# Patient Record
Sex: Female | Born: 1980 | Hispanic: No | Marital: Married | Smoking: Never smoker
Health system: Southern US, Community
[De-identification: ages and names within clinical notes are randomized; demographics above are authoritative.]

---

## 2007-03-23 ENCOUNTER — Inpatient Hospital Stay (HOSPITAL_COMMUNITY): Admission: AD | Admit: 2007-03-23 | Discharge: 2007-03-25 | Payer: Self-pay | Admitting: Obstetrics and Gynecology

## 2010-09-21 NOTE — H&P (Signed)
Amanda Levy, Amanda Levy               ACCOUNT NO.:  [id number]   MEDICAL RECORD NO.:  [id number]          PATIENT TYPE:  INP   LOCATION:  9164                          FACILITY:  [facility]   PHYSICIAN:  Lenoard Aden, M.D.DATE OF BIRTH:  [birth year]   DATE OF ADMISSION:  03/23/2007  DATE OF DISCHARGE:                              HISTORY & PHYSICAL   CHIEF COMPLAINT:  Labor.   She is a 30 year old white female, G1, P0, at 39-1/2 weeks' gestation  with spontaneous onset of contractions this evening.  She has no known  drug allergies.   MEDICATIONS:  Prenatal vitamins.   She is a nonsmoker, nondrinker.  She denies domestic or physical  violence.  She has a noncontributory surgical history except for wisdom  tooth extraction and myringotomy tubes as a child.  She has a family  history of diabetes, thyroid dysfunction, migraine headaches, and  depression.   PHYSICAL EXAM:  She is a well-developed, well-nourished, white female in  no acute distress.  HEENT:  Normal.  LUNGS:  Clear.  HEART:  Regular rate.  ABDOMEN:  Soft, gravid, and nontender.  Estimated fetal weight is 7.5  pounds.  Cervix is 3-4, 80% vertex, -1.  EXTREMITIES:  No cords.  NEUROLOGIC:  Intact.  NST is reactive.   IMPRESSION:  Term intrauterine pregnancy in active labor.   PLAN:  Anticipate attempts at vaginal delivery.  Epidural as needed,  analgesia as requested.      Lenoard Aden, M.D.  Electronically Signed     RJT/MEDQ  D:  03/23/2007  T:  03/23/2007  Job:  [id number]

## 2010-10-01 ENCOUNTER — Inpatient Hospital Stay (HOSPITAL_COMMUNITY)
Admission: AD | Admit: 2010-10-01 | Discharge: 2010-10-03 | DRG: 775 | Disposition: A | Discharge status: Home / Self Care | Payer: 59 | Source: Ambulatory Visit | Attending: Obstetrics and Gynecology | Admitting: Obstetrics and Gynecology

## 2010-10-01 DIAGNOSIS — Z22330 Carrier of Group B streptococcus: Secondary | ICD-10-CM

## 2010-10-01 DIAGNOSIS — O99892 Other specified diseases and conditions complicating childbirth: Secondary | ICD-10-CM | POA: Diagnosis present

## 2010-10-01 LAB — CBC
HCT: 32.1 % — ABNORMAL LOW (ref 36.0–46.0)
Hemoglobin: 10.3 g/dL — ABNORMAL LOW (ref 12.0–15.0)
MCH: 28.1 pg (ref 26.0–34.0)
MCV: 87.5 fL (ref 78.0–100.0)
Platelets: 148 10*3/uL — ABNORMAL LOW (ref 150–400)
RBC: 3.67 MIL/uL — ABNORMAL LOW (ref 3.87–5.11)
WBC: 10.2 10*3/uL (ref 4.0–10.5)

## 2010-10-02 LAB — CBC
HCT: 27.9 % — ABNORMAL LOW (ref 36.0–46.0)
Hemoglobin: 9.1 g/dL — ABNORMAL LOW (ref 12.0–15.0)
MCH: 28.4 pg (ref 26.0–34.0)
MCHC: 32.6 g/dL (ref 30.0–36.0)
MCV: 87.2 fL (ref 78.0–100.0)
RBC: 3.2 MIL/uL — ABNORMAL LOW (ref 3.87–5.11)

## 2010-10-07 NOTE — Discharge Summary (Signed)
Amanda Levy, Amanda Levy               ACCOUNT NO.:  [id number]  MEDICAL RECORD NO.:  [id number]           PATIENT TYPE:  I  LOCATION:  9102                          FACILITY:  [facility]  PHYSICIAN:  Sherron Monday, MD        DATE OF BIRTH:  10-Nov-1980  DATE OF ADMISSION:  10/01/2010 DATE OF DISCHARGE:                              DISCHARGE SUMMARY   ADMITTING DIAGNOSIS:  Intrauterine pregnancy at term in labor with cervical change.  DISCHARGE DIAGNOSIS:  Intrauterine pregnancy at term in labor with cervical change, delivered.  HISTORY OF PRESENT ILLNESS:  A 30 year old, G3, P2-0-0-2 at 58 plus weeks in early labor with cervical change in the office from 1 to 2 or 3 cm.  Contractions are increasing in intensity and frequency.  No loss of fluid and small vaginal bleeding, otherwise uncomplicated prenatal care.  PAST MEDICAL HISTORY:  Not significant.  PAST SURGICAL HISTORY:  Not significant.  PAST OBSTETRICS AND GYNECOLOGIC HISTORY:  G1, 39-week, 8-pound 3-ounce female infant.  G2, 36-week, 8-pound 4-ounce female infant.  G3 is the present pregnancy.  No history of any abnormal Pap or sexually transmitted diseases.  MEDICATIONS:  None.  ALLERGIES:  No known drug allergies.  SOCIAL HISTORY:  Denies alcohol, tobacco or drug use and is married.  FAMILY HISTORY:  Significant for depression, diabetes, COPD, migraines and thyroid disease.  PRENATAL LABORATORY DATA:  O positive, antibody screen negative. Hemoglobin 12.4, rubella immune.  Pap smear within normal limits.  RPR nonreactive.  Urine culture negative, hepatitis B surface antigen negative, HIV negative, platelets 225,000.  Gonorrhea negative. Chlamydia negative.  Cystic fibrosis screen negative.  Glucola 72. Group B strep was positive.  First trimester screen within normal limits.  First trimester ultrasound consistent with dates, normal anatomy, posterior placenta, female infant anatomy screen.  PHYSICAL EXAMINATION:  Afebrile.   Vital signs stable with a benign exam. She was admitted.  I have given penicillin for group B strep prophylaxis.  Her membranes were ruptured for clear fluid, started her in the labor.  She progressed complete, complete +2 after pushing that was reduced.  She pushed for 10-15 minutes to deliver a viable female infant at 2140 on the night of the 25th, Apgars of 9 at one and 9 at five minutes and a weight of 9 pound 1 ounce.  Placenta was expressed intact after cord blood collection.  First-degree perineal laceration repaired with 3-0 Vicryl Rapide in a typical fashion.  Her postpartum course was relatively uncomplicated.  She remained afebrile.  Vital signs stable and benign exam.  Hemoglobin decreased from 10.3-9.1 which was well tolerated.  She was discharged home on postpartum day 2, ambulating, voiding, having no stated difficulties.  She is discharged home after her son was circumcised with routine discharge instructions and numbers to call with any questions or problems as well as prescription for Motrin, Vicodin and prenatal vitamins.  She will follow up in the office in approximately 6 weeks.     Sherron Monday, MD     JB/MEDQ  D:  10/03/2010  T:  10/03/2010  Job:  [id number]  Electronically Signed  by Sherron Monday MD on 10/07/2010 10:42:27 AM

## 2011-02-15 LAB — CBC
HCT: 24.8 — ABNORMAL LOW
Hemoglobin: 8.6 — ABNORMAL LOW
Hemoglobin: 11.7 — ABNORMAL LOW
MCHC: 34.8
MCHC: 34.9
MCV: 92.7
Platelets: 169
Platelets: 189
RDW: 12.9
RDW: 12.7

## 2014-12-29 ENCOUNTER — Other Ambulatory Visit: Payer: Self-pay | Admitting: Nurse Practitioner

## 2014-12-29 DIAGNOSIS — N63 Unspecified lump in unspecified breast: Secondary | ICD-10-CM

## 2014-12-31 ENCOUNTER — Ambulatory Visit
Admission: RE | Admit: 2014-12-31 | Discharge: 2014-12-31 | Disposition: A | Discharge status: Home / Self Care | Source: Ambulatory Visit | Attending: Nurse Practitioner | Admitting: Nurse Practitioner

## 2014-12-31 ENCOUNTER — Ambulatory Visit
Admission: RE | Admit: 2014-12-31 | Discharge: 2014-12-31 | Disposition: A | Discharge status: Home / Self Care | Payer: Medicaid Other | Source: Ambulatory Visit | Attending: Nurse Practitioner | Admitting: Nurse Practitioner

## 2014-12-31 DIAGNOSIS — N63 Unspecified lump in unspecified breast: Secondary | ICD-10-CM

## 2018-10-02 ENCOUNTER — Encounter: Payer: Self-pay | Admitting: Obstetrics and Gynecology

## 2018-10-22 ENCOUNTER — Ambulatory Visit (INDEPENDENT_AMBULATORY_CARE_PROVIDER_SITE_OTHER): Admitting: Obstetrics and Gynecology

## 2018-10-22 ENCOUNTER — Other Ambulatory Visit: Payer: Self-pay

## 2018-10-22 ENCOUNTER — Encounter: Payer: Self-pay | Admitting: Obstetrics and Gynecology

## 2018-10-22 VITALS — BP 108/68 | HR 72 | Temp 97.9°F | Ht 64.76 in | Wt 153.2 lb

## 2018-10-22 DIAGNOSIS — Z124 Encounter for screening for malignant neoplasm of cervix: Secondary | ICD-10-CM

## 2018-10-22 DIAGNOSIS — Z23 Encounter for immunization: Secondary | ICD-10-CM

## 2018-10-22 DIAGNOSIS — Z01419 Encounter for gynecological examination (general) (routine) without abnormal findings: Secondary | ICD-10-CM

## 2018-10-22 DIAGNOSIS — Z0000 Encounter for general adult medical examination without abnormal findings: Secondary | ICD-10-CM

## 2018-10-22 NOTE — Progress Notes (Signed)
38 y.o. G32P3003 Married Other or two or more races Not Hispanic or Latino female here for annual exam.  Sexually active, using condoms. Considering vasectomy. No dyspareunia.   Period Cycle (Days): 28 Period Duration (Days): 4 days Period Pattern: Regular Menstrual Flow: Moderate, Heavy Menstrual Control: Tampon Menstrual Control Change Freq (Hours): changes tampon every 2 hours Dysmenorrhea: None  She can saturate a super + tampon in 4 hours.   Patient's last menstrual period was 09/26/2018 (approximate).          Sexually active: Yes.    The current method of family planning is condoms most of the time.    Exercising: Yes.    crossfit Smoker:  no  Health Maintenance: Pap:  6 years ago, WNL History of abnormal Pap:  no MMG:  12/31/2014 Birads 1 negative TDaP:  Unsure Gardasil: No   reports that she has never smoked. She has never used smokeless tobacco. She reports current alcohol use. She reports that she does not use drugs. Rare ETOH. 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 She has 3 boys, 11,10 and 8.   History reviewed. No pertinent past medical history.  History reviewed. No pertinent surgical history.  No current outpatient medications on file.   No current facility-administered medications for this visit.     Family History  Problem Relation Age of Onset  . Diabetes Maternal Grandmother   . Diabetes Paternal Grandmother     Review of Systems  Constitutional: Negative.   HENT: Negative.   Eyes: Negative.   Respiratory: Negative.   Cardiovascular: Negative.   Gastrointestinal: Negative.   Endocrine: Negative.   Genitourinary: Negative.   Musculoskeletal: Negative.   Skin: Negative.   Allergic/Immunologic: Negative.   Neurological: Negative.   Hematological: Negative.   Psychiatric/Behavioral: Negative.     Exam:   BP 108/68 (BP Location: Right Arm, Patient Position: Sitting, Cuff Size: Normal)   Pulse 72   Temp 97.9 F (36.6 C) (Skin)    Ht 5' 4.76" (1.645 m)   Wt 153 lb 3.2 oz (69.5 kg)   LMP 09/26/2018 (Approximate)   BMI 25.68 kg/m   Weight change: @WEIGHTCHANGE @ Height:   Height: 5' 4.76" (164.5 cm)  Ht Readings from Last 3 Encounters:  10/22/18 5' 4.76" (1.645 m)    General appearance: alert, cooperative and appears stated age Head: Normocephalic, without obvious abnormality, atraumatic Neck: no adenopathy, supple, symmetrical, trachea midline and thyroid normal to inspection and palpation Lungs: clear to auscultation bilaterally Cardiovascular: regular rate and rhythm Breasts: normal appearance, no masses or tenderness Abdomen: soft, non-tender; non distended,  no masses,  no organomegaly Extremities: extremities normal, atraumatic, no cyanosis or edema Skin: Skin color, texture, turgor normal. No rashes or lesions Lymph nodes: Cervical, supraclavicular, and axillary nodes normal. No abnormal inguinal nodes palpated Neurologic: Grossly normal   Pelvic: External genitalia:  no lesions              Urethra:  normal appearing urethra with no masses, tenderness or lesions              Bartholins and Skenes: normal                 Vagina: normal appearing vagina with normal color and discharge, no lesions              Cervix: no lesions               Bimanual Exam:  Uterus:  normal size, contour,  position, consistency, mobility, non-tender, anteverted              Adnexa: no mass, fullness, tenderness               Rectovaginal: Confirms               Anus:  normal sphincter tone, no lesions  Chaperone was present for exam.  A:  Well Woman with normal exam  P:   Pap with hpv  Discussed breast self exam  Discussed calcium and vit D intake  Screening labs  TDAP  Condoms for contraception, considering vasectomy

## 2018-10-22 NOTE — Patient Instructions (Signed)
EXERCISE AND DIET:  We recommended that you start or continue a regular exercise program for good health. Regular exercise means any activity that makes your heart beat faster and makes you sweat.  We recommend exercising at least 30 minutes per day at least 3 days a week, preferably 4 or 5.  We also recommend a diet low in fat and sugar.  Inactivity, poor dietary choices and obesity can cause diabetes, heart attack, stroke, and kidney damage, among others.    ALCOHOL AND SMOKING:  Women should limit their alcohol intake to no more than 7 drinks/beers/glasses of wine (combined, not each!) per week. Moderation of alcohol intake to this level decreases your risk of breast cancer and liver damage. And of course, no recreational drugs are part of a healthy lifestyle.  And absolutely no smoking or even second hand smoke. Most people know smoking can cause heart and lung diseases, but did you know it also contributes to weakening of your bones? Aging of your skin?  Yellowing of your teeth and nails?  CALCIUM AND VITAMIN D:  Adequate intake of calcium and Vitamin D are recommended.  The recommendations for exact amounts of these supplements seem to change often, but generally speaking 1,000 mg of calcium (between diet and supplement) and 800 units of Vitamin D per day seems prudent. Certain women may benefit from higher intake of Vitamin D.  If you are among these women, your doctor will have told you during your visit.    PAP SMEARS:  Pap smears, to check for cervical cancer or precancers,  have traditionally been done yearly, although recent scientific advances have shown that most women can have pap smears less often.  However, every woman still should have a physical exam from her gynecologist every year. It will include a breast check, inspection of the vulva and vagina to check for abnormal growths or skin changes, a visual exam of the cervix, and then an exam to evaluate the size and shape of the uterus and  ovaries.  And after 36 years of age, a rectal exam is indicated to check for rectal cancers. We will also provide age appropriate advice regarding health maintenance, like when you should have certain vaccines, screening for sexually transmitted diseases, bone density testing, colonoscopy, mammograms, etc.   MAMMOGRAMS:  All women over 40 years old should have a yearly mammogram. Many facilities now offer a "3D" mammogram, which may cost around $50 extra out of pocket. If possible,  we recommend you accept the option to have the 3D mammogram performed.  It both reduces the number of women who will be called back for extra views which then turn out to be normal, and it is better than the routine mammogram at detecting truly abnormal areas.    COLON CANCER SCREENING: Now recommend starting at age 45. At this time colonoscopy is not covered for routine screening until 50. There are take home tests that can be done between 45-49.   COLONOSCOPY:  Colonoscopy to screen for colon cancer is recommended for all women at age 50.  We know, you hate the idea of the prep.  We agree, BUT, having colon cancer and not knowing it is worse!!  Colon cancer so often starts as a polyp that can be seen and removed at colonscopy, which can quite literally save your life!  And if your first colonoscopy is normal and you have no family history of colon cancer, most women don't have to have it again for   10 years.  Once every ten years, you can do something that may end up saving your life, right?  We will be happy to help you get it scheduled when you are ready.  Be sure to check your insurance coverage so you understand how much it will cost.  It may be covered as a preventative service at no cost, but you should check your particular policy.      Breast Self-Awareness Breast self-awareness means being familiar with how your breasts look and feel. It involves checking your breasts regularly and reporting any changes to your  health care provider. Practicing breast self-awareness is important. A change in your breasts can be a sign of a serious medical problem. Being familiar with how your breasts look and feel allows you to find any problems early, when treatment is more likely to be successful. All women should practice breast self-awareness, including women who have had breast implants. How to do a breast self-exam One way to learn what is normal for your breasts and whether your breasts are changing is to do a breast self-exam. To do a breast self-exam: Look for Changes  1. Remove all the clothing above your waist. 2. Stand in front of a mirror in a room with good lighting. 3. Put your hands on your hips. 4. Push your hands firmly downward. 5. Compare your breasts in the mirror. Look for differences between them (asymmetry), such as: ? Differences in shape. ? Differences in size. ? Puckers, dips, and bumps in one breast and not the other. 6. Look at each breast for changes in your skin, such as: ? Redness. ? Scaly areas. 7. Look for changes in your nipples, such as: ? Discharge. ? Bleeding. ? Dimpling. ? Redness. ? A change in position. Feel for Changes Carefully feel your breasts for lumps and changes. It is best to do this while lying on your back on the floor and again while sitting or standing in the shower or tub with soapy water on your skin. Feel each breast in the following way:  Place the arm on the side of the breast you are examining above your head.  Feel your breast with the other hand.  Start in the nipple area and make  inch (2 cm) overlapping circles to feel your breast. Use the pads of your three middle fingers to do this. Apply light pressure, then medium pressure, then firm pressure. The light pressure will allow you to feel the tissue closest to the skin. The medium pressure will allow you to feel the tissue that is a little deeper. The firm pressure will allow you to feel the tissue  close to the ribs.  Continue the overlapping circles, moving downward over the breast until you feel your ribs below your breast.  Move one finger-width toward the center of the body. Continue to use the  inch (2 cm) overlapping circles to feel your breast as you move slowly up toward your collarbone.  Continue the up and down exam using all three pressures until you reach your armpit.  Write Down What You Find  Write down what is normal for each breast and any changes that you find. Keep a written record with breast changes or normal findings for each breast. By writing this information down, you do not need to depend only on memory for size, tenderness, or location. Write down where you are in your menstrual cycle, if you are still menstruating. If you are having trouble noticing differences   in your breasts, do not get discouraged. With time you will become more familiar with the variations in your breasts and more comfortable with the exam. How often should I examine my breasts? Examine your breasts every month. If you are breastfeeding, the best time to examine your breasts is after a feeding or after using a breast pump. If you menstruate, the best time to examine your breasts is 5-7 days after your period is over. During your period, your breasts are lumpier, and it may be more difficult to notice changes. When should I see my health care provider? See your health care provider if you notice:  A change in shape or size of your breasts or nipples.  A change in the skin of your breast or nipples, such as a reddened or scaly area.  Unusual discharge from your nipples.  A lump or thick area that was not there before.  Pain in your breasts.  Anything that concerns you.  

## 2018-10-23 ENCOUNTER — Other Ambulatory Visit (HOSPITAL_COMMUNITY)
Admission: RE | Admit: 2018-10-23 | Discharge: 2018-10-23 | Disposition: A | Discharge status: Home / Self Care | Source: Ambulatory Visit | Attending: Obstetrics and Gynecology | Admitting: Obstetrics and Gynecology

## 2018-10-23 DIAGNOSIS — Z124 Encounter for screening for malignant neoplasm of cervix: Secondary | ICD-10-CM | POA: Insufficient documentation

## 2018-10-23 NOTE — Addendum Note (Signed)
Addended by: Dorothy Spark on: 10/23/2018 06:02 PM   Modules accepted: Orders

## 2018-10-24 ENCOUNTER — Telehealth: Payer: Self-pay

## 2018-10-24 LAB — CBC
Hematocrit: 38.7 % (ref 34.0–46.6)
Hemoglobin: 13 g/dL (ref 11.1–15.9)
MCH: 31.3 pg (ref 26.6–33.0)
MCHC: 33.6 g/dL (ref 31.5–35.7)
MCV: 93 fL (ref 79–97)
Platelets: 197 10*3/uL (ref 150–450)
RBC: 4.15 x10E6/uL (ref 3.77–5.28)
RDW: 11.6 % — ABNORMAL LOW (ref 11.7–15.4)
WBC: 6.5 10*3/uL (ref 3.4–10.8)

## 2018-10-24 LAB — COMPREHENSIVE METABOLIC PANEL
ALT: 13 IU/L (ref 0–32)
AST: 19 IU/L (ref 0–40)
Albumin/Globulin Ratio: 2 (ref 1.2–2.2)
Albumin: 4.5 g/dL (ref 3.8–4.8)
Alkaline Phosphatase: 45 IU/L (ref 39–117)
BUN/Creatinine Ratio: 20 (ref 9–23)
BUN: 20 mg/dL (ref 6–20)
Bilirubin Total: 0.4 mg/dL (ref 0.0–1.2)
CO2: 21 mmol/L (ref 20–29)
Calcium: 9.1 mg/dL (ref 8.7–10.2)
Chloride: 103 mmol/L (ref 96–106)
Creatinine, Ser: 1.02 mg/dL — ABNORMAL HIGH (ref 0.57–1.00)
GFR calc Af Amer: 82 mL/min/{1.73_m2} (ref >59)
GFR calc non Af Amer: 71 mL/min/{1.73_m2} (ref >59)
Globulin, Total: 2.3 g/dL (ref 1.5–4.5)
Glucose: 86 mg/dL (ref 65–99)
Potassium: 4 mmol/L (ref 3.5–5.2)
Sodium: 138 mmol/L (ref 134–144)
Total Protein: 6.8 g/dL (ref 6.0–8.5)

## 2018-10-24 LAB — LIPID PANEL
Chol/HDL Ratio: 1.7 ratio (ref 0.0–4.4)
Cholesterol, Total: 138 mg/dL (ref 100–199)
HDL: 81 mg/dL (ref >39)
LDL Calculated: 46 mg/dL (ref 0–99)
Triglycerides: 57 mg/dL (ref 0–149)
VLDL Cholesterol Cal: 11 mg/dL (ref 5–40)

## 2018-10-24 NOTE — Telephone Encounter (Signed)
Left message to call Seneca Hoback at [phone]. 

## 2018-10-24 NOTE — Telephone Encounter (Signed)
-----   Message from Salvadore Dom, MD sent at 10/24/2018 11:40 AM EDT ----- Please let the patient know that her creatinine is just above normal. This can happen from dehydration. The rest of her blood work is great. Her pap is pending. She should return well hydrated for a renal panel.

## 2018-10-25 LAB — CYTOLOGY - PAP
Diagnosis: NEGATIVE
HPV: NOT DETECTED

## 2018-10-29 NOTE — Telephone Encounter (Signed)
Left message to call Kaitlyn at [phone]. 

## 2018-10-30 NOTE — Telephone Encounter (Signed)
Patient returned call

## 2018-10-30 NOTE — Telephone Encounter (Signed)
Left message to call Kaitlyn at [phone]. 

## 2018-11-08 NOTE — Telephone Encounter (Signed)
Left message to call [name] at [phone]. 

## 2018-11-19 NOTE — Telephone Encounter (Signed)
Unable to reach patient. MyChart message sent. Encounter closed.

## 2019-09-14 ENCOUNTER — Ambulatory Visit: Attending: Internal Medicine

## 2019-09-14 DIAGNOSIS — Z23 Encounter for immunization: Secondary | ICD-10-CM

## 2019-09-14 NOTE — Progress Notes (Signed)
   Covid-19 Vaccination Clinic  Name:  Amanda Levy    MRN: [id number] DOB: December 14, 1980  09/14/2019  [name] was observed post Covid-19 immunization for 15 minutes without incident. She was provided with Vaccine Information Sheet and instruction to access the V-Safe system.   [name] was instructed to call 911 with any severe reactions post vaccine: Marland Kitchen Difficulty breathing  . Swelling of face and throat  . A fast heartbeat  . A bad rash all over body  . Dizziness and weakness   Immunizations Administered    Name Date Dose VIS Date Route   Pfizer COVID-19 Vaccine 09/14/2019 11:34 AM 0.3 mL 07/03/2018 Intramuscular   Manufacturer: [employer], [employer]   Lot: [id number]   NDC: [address]-5

## 2019-10-08 ENCOUNTER — Ambulatory Visit: Attending: Internal Medicine

## 2019-10-28 NOTE — Progress Notes (Deleted)
39 y.o. G62P3003 Married Other or two or more races Not Hispanic or Latino female here for annual exam.      No LMP recorded.          Sexually active: {yes no:314532}  The current method of family planning is {contraception:315051}.    Exercising: {yes no:314532}  {types:19826} Smoker:  {YES [id number]  Health Maintenance: Pap:  10/23/18 WNL HR HPV Neg  History of abnormal Pap:  no MMG:   12/31/2014 Birads 1 negative TDaP:  10/22/2018 Gardasil: no   reports that she has never smoked. She has never used smokeless tobacco. She reports current alcohol use. She reports that she does not use drugs.  No past medical history on file.  No past surgical history on file.  No current outpatient medications on file.   No current facility-administered medications for this visit.    Family History  Problem Relation Age of Onset  . Diabetes Maternal Grandmother   . Diabetes Paternal Grandmother     Review of Systems  Exam:   There were no vitals taken for this visit.  Weight change: @WEIGHTCHANGE @ Height:      Ht Readings from Last 3 Encounters:  10/22/18 5' 4.76" (1.645 m)    General appearance: alert, cooperative and appears stated age Head: Normocephalic, without obvious abnormality, atraumatic Neck: no adenopathy, supple, symmetrical, trachea midline and thyroid {CHL AMB PHY EX THYROID NORM DEFAULT:[phone]::"normal to inspection and palpation"} Lungs: clear to auscultation bilaterally Cardiovascular: regular rate and rhythm Breasts: {Exam; breast:13139::"normal appearance, no masses or tenderness"} Abdomen: soft, non-tender; non distended,  no masses,  no organomegaly Extremities: extremities normal, atraumatic, no cyanosis or edema Skin: Skin color, texture, turgor normal. No rashes or lesions Lymph nodes: Cervical, supraclavicular, and axillary nodes normal. No abnormal inguinal nodes palpated Neurologic: Grossly normal   Pelvic: External genitalia:  no lesions               Urethra:  normal appearing urethra with no masses, tenderness or lesions              Bartholins and Skenes: normal                 Vagina: normal appearing vagina with normal color and discharge, no lesions              Cervix: {CHL AMB PHY EX CERVIX NORM [phone]::"no lesions"}               Bimanual Exam:  Uterus:  {CHL AMB PHY EX UTERUS NORM DEFAULT:[phone]::"normal size, contour, position, consistency, mobility, non-tender"}              Adnexa: {CHL AMB PHY EX ADNEXA NO MASS [phone]::"no mass, fullness, tenderness"}               Rectovaginal: Confirms               Anus:  normal sphincter tone, no lesions  *** chaperoned for the exam.  A:  Well Woman with normal exam  P:

## 2019-10-30 ENCOUNTER — Ambulatory Visit: Admitting: Obstetrics and Gynecology

## 2020-01-29 NOTE — Progress Notes (Signed)
39 y.o. G57P3003 Married Other or two or more races Not Hispanic or Latino female here for annual exam.  No concerns. Period Cycle (Days): 28 Period Duration (Days): 4 Period Pattern: Regular Menstrual Flow: Light Menstrual Control: Tampon Menstrual Control Change Freq (Hours): 3 Dysmenorrhea: (!) Mild Dysmenorrhea Symptoms: Cramping  Patient's last menstrual period was 01/08/2020.          Sexually active: Yes.    The current method of family planning is condoms always.    Exercising: Yes.    cross fit  Smoker:  no  Health Maintenance: Pap: 10/23/18 WNL Hr HPV Neg  History of abnormal Pap:  no MMG:  Never  BMD:   Never  Colonoscopy: never  TDaP:  10/22/18  Gardasil: no   reports that she has never smoked. She has never used smokeless tobacco. She reports current alcohol use. She reports that she does not use drugs. 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 No pertinent past medical history.  History reviewed. No pertinent surgical history.  No current outpatient medications on file.   No current facility-administered medications for this visit.    Family History  Problem Relation Age of Onset  . Diabetes Maternal Grandmother   . Diabetes Paternal Grandmother     Review of Systems  All other systems reviewed and are negative.   Exam:   BP 98/60   Pulse (!) 59   Ht 5' 4.5" (1.638 m)   Wt 144 lb (65.3 kg)   LMP 01/08/2020   SpO2 99%   BMI 24.34 kg/m   Weight change: @WEIGHTCHANGE @ Height:   Height: 5' 4.5" (163.8 cm)  Ht Readings from Last 3 Encounters:  01/30/20 5' 4.5" (1.638 m)  10/22/18 5' 4.76" (1.645 m)    General appearance: alert, cooperative and appears stated age Head: Normocephalic, without obvious abnormality, atraumatic Neck: no adenopathy, supple, symmetrical, trachea midline and thyroid normal to inspection and palpation Lungs: clear to auscultation bilaterally Cardiovascular: regular  rate and rhythm Breasts: normal appearance, no masses or tenderness. Bilateral fibrocystic changes, symmetrical Abdomen: soft, non-tender; non distended,  no masses,  no organomegaly Extremities: extremities normal, atraumatic, no cyanosis or edema Skin: Skin color, texture, turgor normal. No rashes or lesions Lymph nodes: Cervical, supraclavicular, and axillary nodes normal. No abnormal inguinal nodes palpated Neurologic: Grossly normal   Pelvic: External genitalia:  no lesions              Urethra:  normal appearing urethra with no masses, tenderness or lesions              Bartholins and Skenes: normal                 Vagina: normal appearing vagina with normal color and discharge, no lesions              Cervix: no lesions               Bimanual Exam:  Uterus:  normal size, contour, position, consistency, mobility, non-tender and anteverted              Adnexa: no mass, fullness, tenderness               Rectovaginal: Confirms               Anus:  normal sphincter tone, no lesions  10/24/18 chaperoned for the exam.  A:  Well Woman with  normal exam  P:   No pap this year  Screening labs  Condoms for contraception  Discussed breast self exam  Discussed calcium and vit D intake

## 2020-01-30 ENCOUNTER — Encounter: Payer: Self-pay | Admitting: Obstetrics and Gynecology

## 2020-01-30 ENCOUNTER — Other Ambulatory Visit: Payer: Self-pay

## 2020-01-30 ENCOUNTER — Ambulatory Visit (INDEPENDENT_AMBULATORY_CARE_PROVIDER_SITE_OTHER): Admitting: Obstetrics and Gynecology

## 2020-01-30 VITALS — BP 98/60 | HR 59 | Ht 64.5 in | Wt 144.0 lb

## 2020-01-30 DIAGNOSIS — Z0000 Encounter for general adult medical examination without abnormal findings: Secondary | ICD-10-CM | POA: Diagnosis not present

## 2020-01-30 DIAGNOSIS — Z01419 Encounter for gynecological examination (general) (routine) without abnormal findings: Secondary | ICD-10-CM

## 2020-01-30 NOTE — Patient Instructions (Signed)
EXERCISE AND DIET:  We recommended that you start or continue a regular exercise program for good health. Regular exercise means any activity that makes your heart beat faster and makes you sweat.  We recommend exercising at least 30 minutes per day at least 3 days a week, preferably 4 or 5.  We also recommend a diet low in fat and sugar.  Inactivity, poor dietary choices and obesity can cause diabetes, heart attack, stroke, and kidney damage, among others.    ALCOHOL AND SMOKING:  Women should limit their alcohol intake to no more than 7 drinks/beers/glasses of wine (combined, not each!) per week. Moderation of alcohol intake to this level decreases your risk of breast cancer and liver damage. And of course, no recreational drugs are part of a healthy lifestyle.  And absolutely no smoking or even second hand smoke. Most people know smoking can cause heart and lung diseases, but did you know it also contributes to weakening of your bones? Aging of your skin?  Yellowing of your teeth and nails?  CALCIUM AND VITAMIN D:  Adequate intake of calcium and Vitamin D are recommended.  The recommendations for exact amounts of these supplements seem to change often, but generally speaking 1,000 mg of calcium (between diet and supplement) and 800 units of Vitamin D per day seems prudent. Certain women may benefit from higher intake of Vitamin D.  If you are among these women, your doctor will have told you during your visit.    PAP SMEARS:  Pap smears, to check for cervical cancer or precancers,  have traditionally been done yearly, although recent scientific advances have shown that most women can have pap smears less often.  However, every woman still should have a physical exam from her gynecologist every year. It will include a breast check, inspection of the vulva and vagina to check for abnormal growths or skin changes, a visual exam of the cervix, and then an exam to evaluate the size and shape of the uterus and  ovaries.  And after 37 years of age, a rectal exam is indicated to check for rectal cancers. We will also provide age appropriate advice regarding health maintenance, like when you should have certain vaccines, screening for sexually transmitted diseases, bone density testing, colonoscopy, mammograms, etc.   MAMMOGRAMS:  All women over 40 years old should have a yearly mammogram. Many facilities now offer a "3D" mammogram, which may cost around $50 extra out of pocket. If possible,  we recommend you accept the option to have the 3D mammogram performed.  It both reduces the number of women who will be called back for extra views which then turn out to be normal, and it is better than the routine mammogram at detecting truly abnormal areas.    COLON CANCER SCREENING: Now recommend starting at age 45. At this time colonoscopy is not covered for routine screening until 50. There are take home tests that can be done between 45-49.   COLONOSCOPY:  Colonoscopy to screen for colon cancer is recommended for all women at age 50.  We know, you hate the idea of the prep.  We agree, BUT, having colon cancer and not knowing it is worse!!  Colon cancer so often starts as a polyp that can be seen and removed at colonscopy, which can quite literally save your life!  And if your first colonoscopy is normal and you have no family history of colon cancer, most women don't have to have it again for   10 years.  Once every ten years, you can do something that may end up saving your life, right?  We will be happy to help you get it scheduled when you are ready.  Be sure to check your insurance coverage so you understand how much it will cost.  It may be covered as a preventative service at no cost, but you should check your particular policy.      Breast Self-Awareness Breast self-awareness means being familiar with how your breasts look and feel. It involves checking your breasts regularly and reporting any changes to your  health care provider. Practicing breast self-awareness is important. A change in your breasts can be a sign of a serious medical problem. Being familiar with how your breasts look and feel allows you to find any problems early, when treatment is more likely to be successful. All women should practice breast self-awareness, including women who have had breast implants. How to do a breast self-exam One way to learn what is normal for your breasts and whether your breasts are changing is to do a breast self-exam. To do a breast self-exam: Look for Changes  1. Remove all the clothing above your waist. 2. Stand in front of a mirror in a room with good lighting. 3. Put your hands on your hips. 4. Push your hands firmly downward. 5. Compare your breasts in the mirror. Look for differences between them (asymmetry), such as: ? Differences in shape. ? Differences in size. ? Puckers, dips, and bumps in one breast and not the other. 6. Look at each breast for changes in your skin, such as: ? Redness. ? Scaly areas. 7. Look for changes in your nipples, such as: ? Discharge. ? Bleeding. ? Dimpling. ? Redness. ? A change in position. Feel for Changes Carefully feel your breasts for lumps and changes. It is best to do this while lying on your back on the floor and again while sitting or standing in the shower or tub with soapy water on your skin. Feel each breast in the following way:  Place the arm on the side of the breast you are examining above your head.  Feel your breast with the other hand.  Start in the nipple area and make  inch (2 cm) overlapping circles to feel your breast. Use the pads of your three middle fingers to do this. Apply light pressure, then medium pressure, then firm pressure. The light pressure will allow you to feel the tissue closest to the skin. The medium pressure will allow you to feel the tissue that is a little deeper. The firm pressure will allow you to feel the tissue  close to the ribs.  Continue the overlapping circles, moving downward over the breast until you feel your ribs below your breast.  Move one finger-width toward the center of the body. Continue to use the  inch (2 cm) overlapping circles to feel your breast as you move slowly up toward your collarbone.  Continue the up and down exam using all three pressures until you reach your armpit.  Write Down What You Find  Write down what is normal for each breast and any changes that you find. Keep a written record with breast changes or normal findings for each breast. By writing this information down, you do not need to depend only on memory for size, tenderness, or location. Write down where you are in your menstrual cycle, if you are still menstruating. If you are having trouble noticing differences   in your breasts, do not get discouraged. With time you will become more familiar with the variations in your breasts and more comfortable with the exam. How often should I examine my breasts? Examine your breasts every month. If you are breastfeeding, the best time to examine your breasts is after a feeding or after using a breast pump. If you menstruate, the best time to examine your breasts is 5-7 days after your period is over. During your period, your breasts are lumpier, and it may be more difficult to notice changes. When should I see my health care provider? See your health care provider if you notice:  A change in shape or size of your breasts or nipples.  A change in the skin of your breast or nipples, such as a reddened or scaly area.  Unusual discharge from your nipples.  A lump or thick area that was not there before.  Pain in your breasts.  Anything that concerns you.  

## 2020-01-31 LAB — CBC
Hematocrit: 39.3 % (ref 34.0–46.6)
Hemoglobin: 13 g/dL (ref 11.1–15.9)
MCH: 30.6 pg (ref 26.6–33.0)
MCHC: 33.1 g/dL (ref 31.5–35.7)
MCV: 93 fL (ref 79–97)
Platelets: 191 10*3/uL (ref 150–450)
RBC: 4.25 x10E6/uL (ref 3.77–5.28)
RDW: 11.8 % (ref 11.7–15.4)
WBC: 5.7 10*3/uL (ref 3.4–10.8)

## 2020-01-31 LAB — LIPID PANEL
Chol/HDL Ratio: 1.8 ratio (ref 0.0–4.4)
Cholesterol, Total: 151 mg/dL (ref 100–199)
HDL: 82 mg/dL (ref >39)
LDL Chol Calc (NIH): 58 mg/dL (ref 0–99)
Triglycerides: 52 mg/dL (ref 0–149)
VLDL Cholesterol Cal: 11 mg/dL (ref 5–40)

## 2020-01-31 LAB — COMPREHENSIVE METABOLIC PANEL
ALT: 10 IU/L (ref 0–32)
AST: 16 IU/L (ref 0–40)
Albumin/Globulin Ratio: 1.9 (ref 1.2–2.2)
Albumin: 4.8 g/dL (ref 3.8–4.8)
Alkaline Phosphatase: 45 IU/L (ref 44–121)
BUN/Creatinine Ratio: 17 (ref 9–23)
BUN: 16 mg/dL (ref 6–20)
Bilirubin Total: 0.6 mg/dL (ref 0.0–1.2)
CO2: 25 mmol/L (ref 20–29)
Calcium: 9.3 mg/dL (ref 8.7–10.2)
Chloride: 102 mmol/L (ref 96–106)
Creatinine, Ser: 0.94 mg/dL (ref 0.57–1.00)
GFR calc Af Amer: 90 mL/min/{1.73_m2} (ref >59)
GFR calc non Af Amer: 78 mL/min/{1.73_m2} (ref >59)
Globulin, Total: 2.5 g/dL (ref 1.5–4.5)
Glucose: 78 mg/dL (ref 65–99)
Potassium: 3.9 mmol/L (ref 3.5–5.2)
Sodium: 139 mmol/L (ref 134–144)
Total Protein: 7.3 g/dL (ref 6.0–8.5)

## 2021-01-04 ENCOUNTER — Emergency Department (HOSPITAL_BASED_OUTPATIENT_CLINIC_OR_DEPARTMENT_OTHER): Payer: BC Managed Care – PPO

## 2021-01-04 ENCOUNTER — Encounter (HOSPITAL_BASED_OUTPATIENT_CLINIC_OR_DEPARTMENT_OTHER): Payer: Self-pay | Admitting: Obstetrics and Gynecology

## 2021-01-04 ENCOUNTER — Emergency Department (HOSPITAL_BASED_OUTPATIENT_CLINIC_OR_DEPARTMENT_OTHER)
Admission: EM | Admit: 2021-01-04 | Discharge: 2021-01-05 | Disposition: A | Discharge status: Home / Self Care | Payer: BC Managed Care – PPO | Attending: Emergency Medicine | Admitting: Emergency Medicine

## 2021-01-04 ENCOUNTER — Other Ambulatory Visit: Payer: Self-pay

## 2021-01-04 DIAGNOSIS — G839 Paralytic syndrome, unspecified: Secondary | ICD-10-CM

## 2021-01-04 DIAGNOSIS — R531 Weakness: Secondary | ICD-10-CM | POA: Insufficient documentation

## 2021-01-04 DIAGNOSIS — R202 Paresthesia of skin: Secondary | ICD-10-CM | POA: Diagnosis not present

## 2021-01-04 LAB — CBC
HCT: 36.8 % (ref 36.0–46.0)
Hemoglobin: 12.7 g/dL (ref 12.0–15.0)
MCH: 30.7 pg (ref 26.0–34.0)
MCHC: 34.5 g/dL (ref 30.0–36.0)
MCV: 88.9 fL (ref 80.0–100.0)
Platelets: 201 10*3/uL (ref 150–400)
RBC: 4.14 MIL/uL (ref 3.87–5.11)
RDW: 12 % (ref 11.5–15.5)
WBC: 5.4 10*3/uL (ref 4.0–10.5)
nRBC: 0 % (ref 0.0–0.2)

## 2021-01-04 LAB — URINALYSIS, ROUTINE W REFLEX MICROSCOPIC
Bilirubin Urine: NEGATIVE
Glucose, UA: NEGATIVE mg/dL
Hgb urine dipstick: NEGATIVE
Ketones, ur: NEGATIVE mg/dL
Leukocytes,Ua: NEGATIVE
Nitrite: NEGATIVE
Protein, ur: NEGATIVE mg/dL
Specific Gravity, Urine: 1.017 (ref 1.005–1.030)
pH: 6.5 (ref 5.0–8.0)

## 2021-01-04 LAB — BASIC METABOLIC PANEL
Anion gap: 10 (ref 5–15)
BUN: 14 mg/dL (ref 6–20)
CO2: 23 mmol/L (ref 22–32)
Calcium: 9.2 mg/dL (ref 8.9–10.3)
Chloride: 104 mmol/L (ref 98–111)
Creatinine, Ser: 1.05 mg/dL — ABNORMAL HIGH (ref 0.44–1.00)
GFR, Estimated: >60 mL/min (ref >60)
Glucose, Bld: 95 mg/dL (ref 70–99)
Potassium: 3.5 mmol/L (ref 3.5–5.1)
Sodium: 137 mmol/L (ref 135–145)

## 2021-01-04 LAB — PREGNANCY, URINE: Preg Test, Ur: NEGATIVE

## 2021-01-04 IMAGING — CT CT HEAD W/O CM
4 series · 17 of 47 positions shown, 19 images · non-contrast
Comparison: None.

CLINICAL DATA: Right-sided arm and leg weakness

EXAM:
CT HEAD WITHOUT CONTRAST
TECHNIQUE: Contiguous axial images were obtained from the base of the skull
through the vertex without intravenous contrast.

[Series 2: head wo · axial · 0.45mm/px · z∈[-402,-286]mm · 7 of 31 slices shown, 9 images]
[im 4/31  brain]
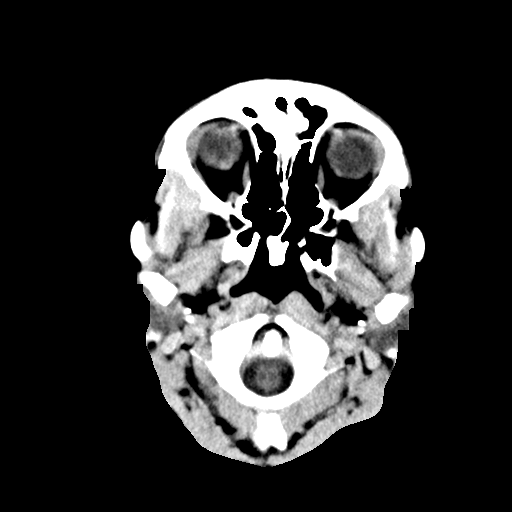
[im 4/31  bone]
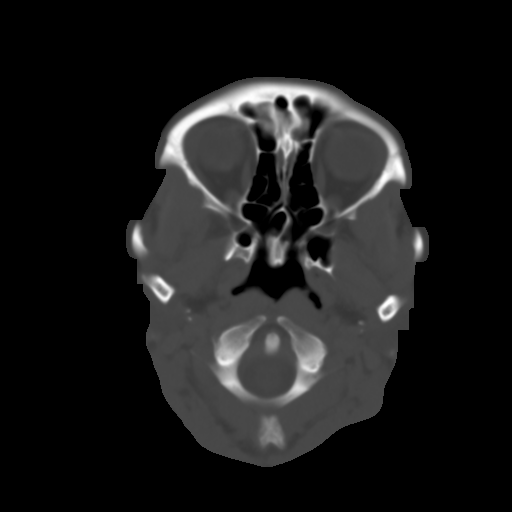
[im 8/31  brain]
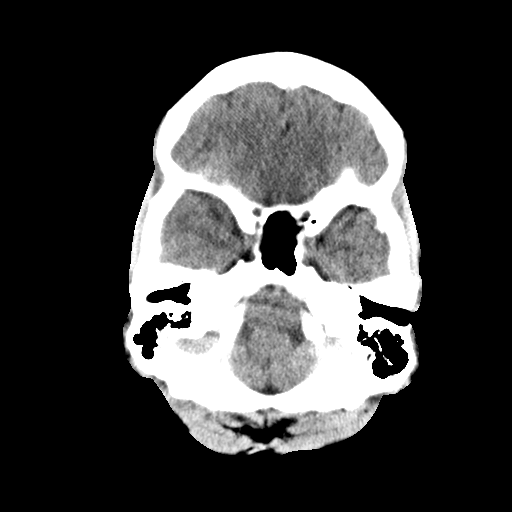
[im 12/31  brain]
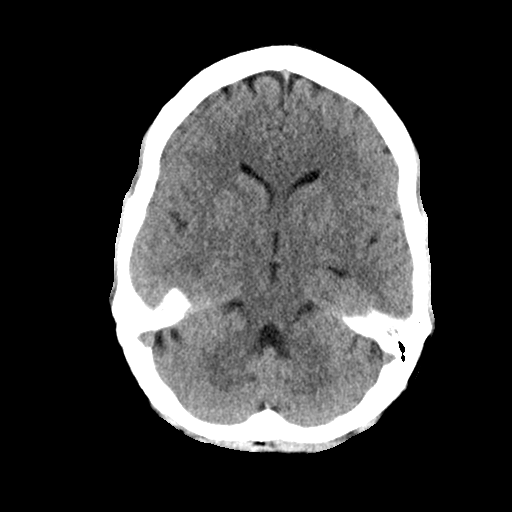
[im 16/31  brain]
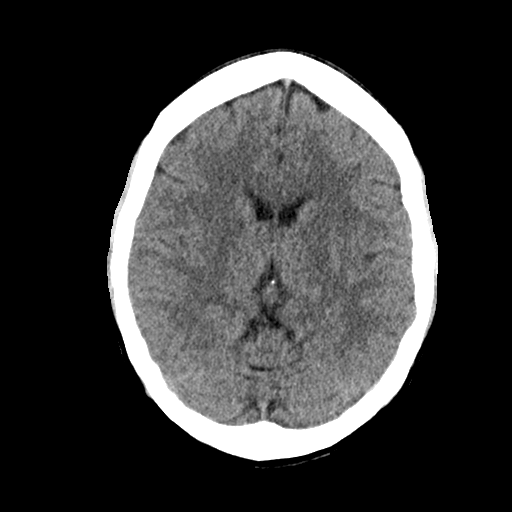
[im 19/31  brain]
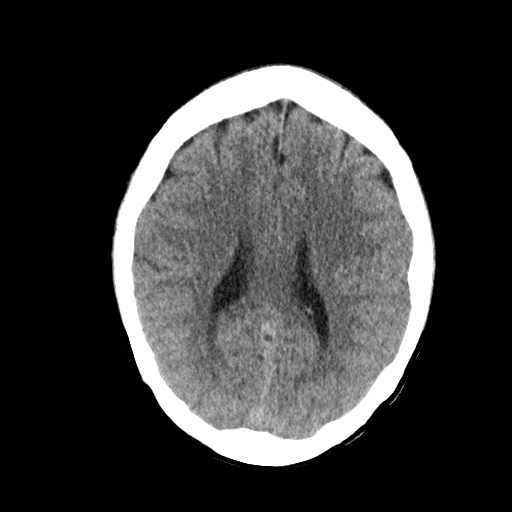
[im 19/31  bone]
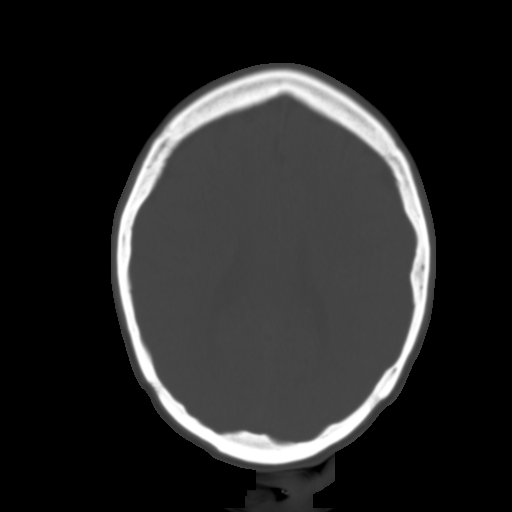
[im 23/31  brain]
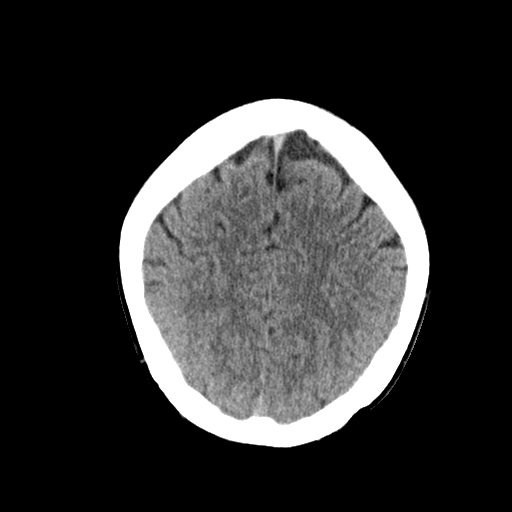
[im 27/31  brain]
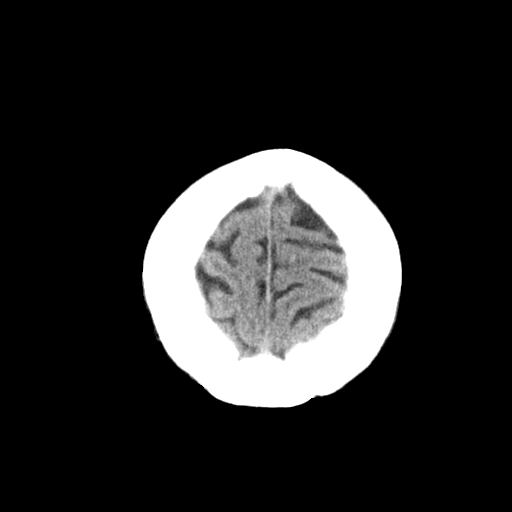

[Series 3: head bone · axial · 0.45mm/px · z∈[-402,-348]mm · 4 of 77 slices shown]
[im 8/77  bone]
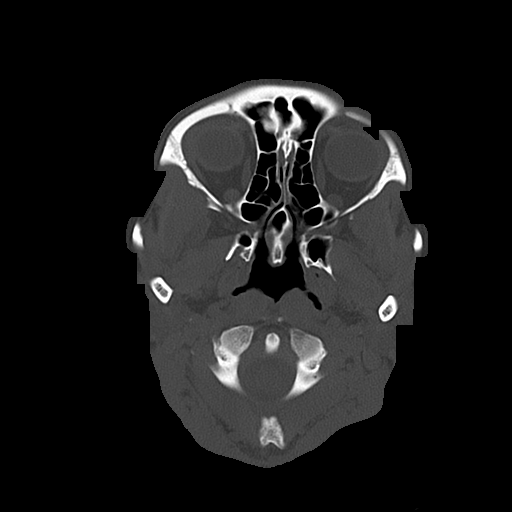
[im 16/77  bone]
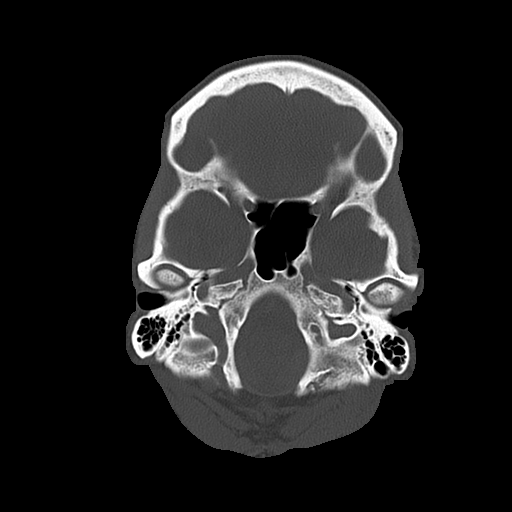
[im 23/77  bone]
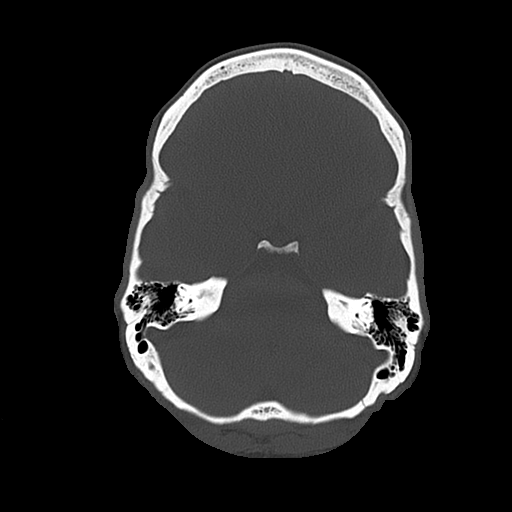
[im 35/77  bone]
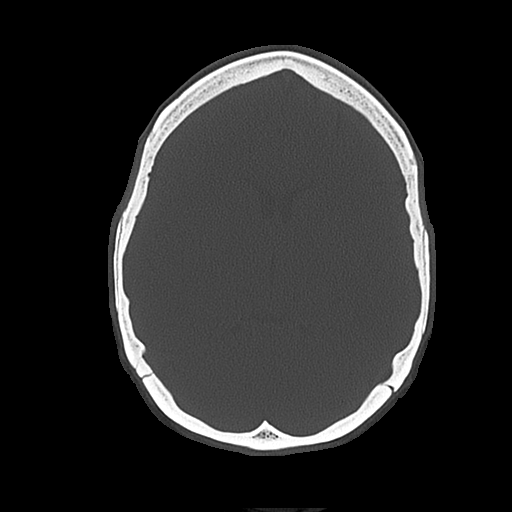

[Series 4: coronal soft · coronal · 0.32mm/px · 3 of 68 slices shown]
[im 26/68  brain]
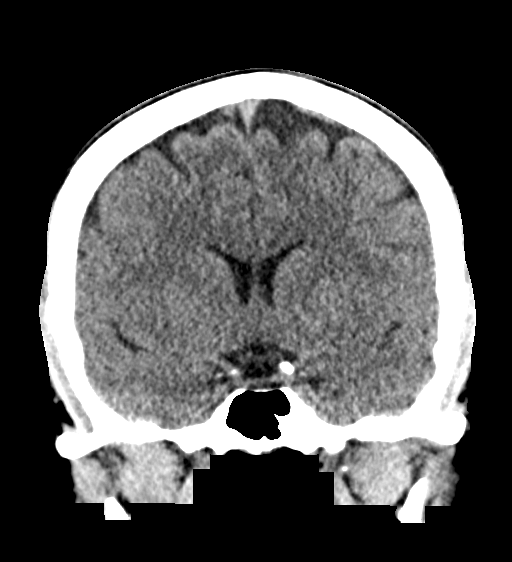
[im 31/68  brain]
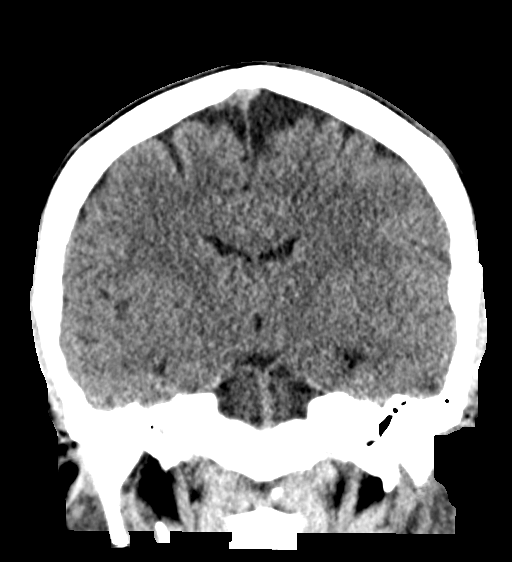
[im 37/68  brain]
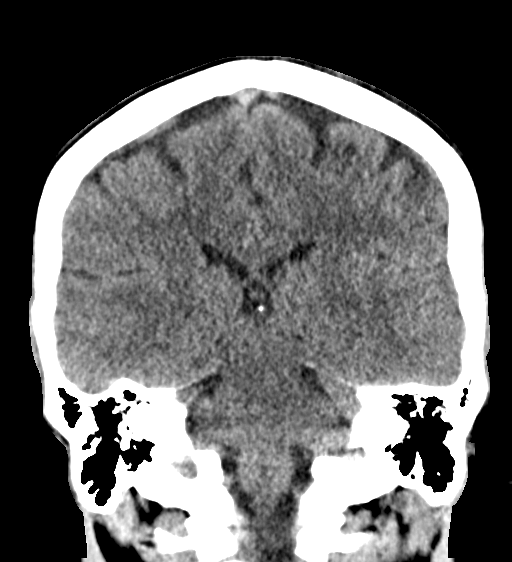

[Series 5: sagittal soft · sagittal · 0.35mm/px · 3 of 55 slices shown]
[im 19/55  brain]
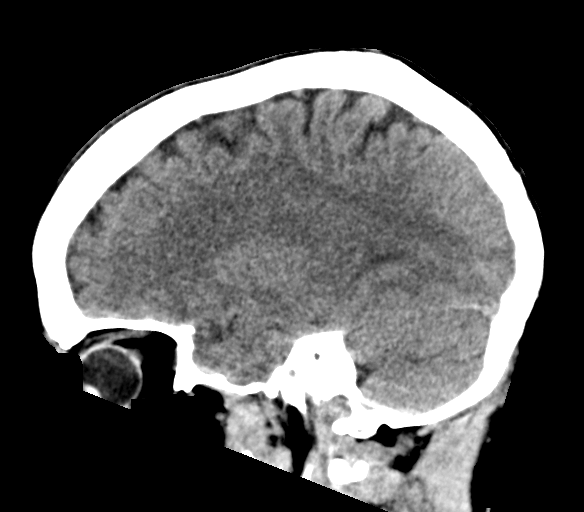
[im 28/55  brain]
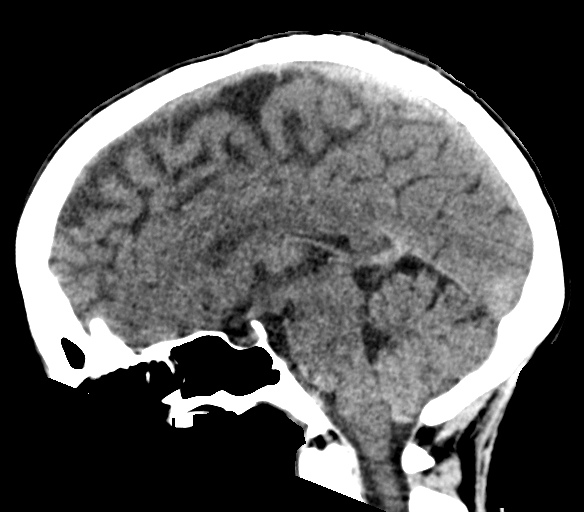
[im 37/55  brain]
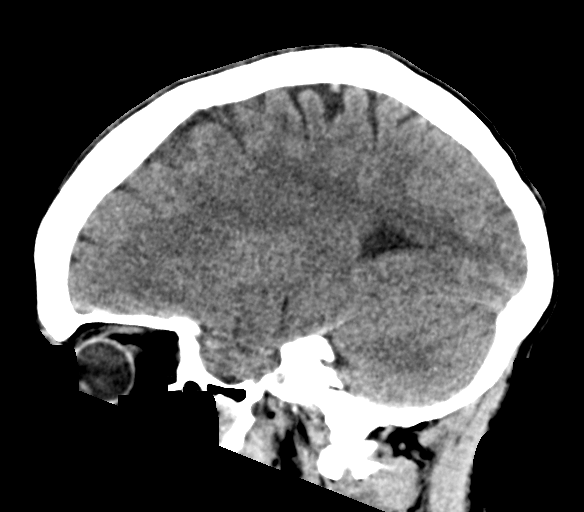

[17 of 47 positions shown; findings below may reference images not displayed]

FINDINGS: Brain: No evidence of acute infarction, hemorrhage, hydrocephalus,
extra-axial collection or mass lesion/mass effect.

Vascular: No hyperdense vessel or unexpected calcification.

Skull: Normal. Negative for fracture or focal lesion.

Sinuses/Orbits: No acute finding.

Other: None
IMPRESSION: Negative non contrasted CT appearance of the brain

## 2021-01-04 NOTE — ED Provider Notes (Signed)
[facility] EMERGENCY DEPT Provider Note   CSN: [id number] Arrival date & time: 01/04/21  1545     History Chief Complaint  Patient presents with   Weakness    Amanda Levy is a 40 y.o. female.   Weakness Associated symptoms: no abdominal pain and no chest pain   Patient presents with right-sided arm weakness and tingling right leg.  States she woke up with the arm weakness morning.  States she is able to move the arm but it takes a lot more effort than normal.  States it is trying to bring the right arm out or had to bring the seatbelt across her body.  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  Did a [employer] workout today and was had much more difficult than normal.  No neck pain.  No headache.  No confusion has not episodes like this before.  Now it states it feels like there is some tingling in the right anterior leg.  States she was able to walk normally.      History reviewed. No pertinent past medical history.  There are no problems to display for this patient.   History reviewed. No pertinent surgical history.   OB History     Gravida  3   Para  3   Term  3   Preterm      AB      Living  3      SAB      IAB      Ectopic      Multiple      Live Births  3           Family History  Problem Relation Age of Onset   Diabetes Maternal Grandmother    Diabetes Paternal Grandmother     Social History   Tobacco Use   Smoking status: Never   Smokeless tobacco: Never  Vaping Use   Vaping Use: Never used  Substance Use Topics   Alcohol use: Yes    Comment: rarely- once a month   Drug use: Never    Home Medications Prior to Admission medications   Not on File    Allergies    Patient has no allergy information on record.  Review of Systems   Review of Systems  HENT:  Negative for congestion.   Cardiovascular:  Negative for chest pain.   Gastrointestinal:  Negative for abdominal pain.  Genitourinary:  Negative for flank pain.  Musculoskeletal:  Negative for neck pain.  Neurological:  Positive for weakness.  Psychiatric/Behavioral:  Negative for confusion.    Physical Exam Updated Vital Signs BP 110/74   Pulse 80   Temp 98.7 F (37.1 C)   Resp 20   Ht 5\' 4"  (1.626 m)   Wt 66.2 kg   LMP 01/01/2021 (Exact Date)   SpO2 98%   BMI 25.06 kg/m   Physical Exam Vitals and nursing note reviewed.  HENT:     Head: Normocephalic.  Eyes:     Pupils: Pupils are equal, round, and reactive to light.  Pulmonary:     Breath sounds: No wheezing or rhonchi.  Abdominal:     Tenderness: There is no abdominal tenderness.  Musculoskeletal:     Cervical back: Neck supple.  Skin:    General: Skin is warm.     Capillary Refill: Capillary refill takes less than 2 seconds.  Neurological:  Mental Status: She is alert and oriented to person, place, and time.     Comments: Face symmetric.  Eye movements intact.  Equal smile.  Good shoulder shrug bilaterally.  Good strength with abduction at shoulder.  Some weakness pulling right hand across her left shoulder across body as if putting on a seatbelt.  Weakness with external rotation at the shoulder.  Good grip strength.  Good flexion at elbow.  May have mildly weakened extension.  Sensation grossly intact over bilateral lower extremities.  Good strength in bilateral lower extremities.  Good sensation and strength over radial median ulnar distribution in right hand.    ED Results / Procedures / Treatments   Labs (all labs ordered are listed, but only abnormal results are displayed) Labs Reviewed  BASIC METABOLIC PANEL - Abnormal; Notable for the following components:      Result Value   Creatinine, Ser 1.05 (*)    All other components within normal limits  CBC  URINALYSIS, ROUTINE W REFLEX MICROSCOPIC  PREGNANCY, URINE    EKG EKG Interpretation  Date/Time:  Monday January 04 2021 15:59:11 EDT Ventricular Rate:  89 PR Interval:  204 QRS Duration: 70 QT Interval:  370 QTC Calculation: 450 R Axis:   87 Text Interpretation: Normal sinus rhythm Normal ECG Confirmed by Benjiman Core [phone]) on 01/04/2021 6:38:16 PM  Radiology CT HEAD WO CONTRAST ( )  Result Date: 01/04/2021 CLINICAL DATA:  Right-sided arm and leg weakness EXAM: CT HEAD WITHOUT CONTRAST TECHNIQUE: Contiguous axial images were obtained from the base of the skull through the vertex without intravenous contrast. COMPARISON:  None. FINDINGS: Brain: No evidence of acute infarction, hemorrhage, hydrocephalus, extra-axial collection or mass lesion/mass effect. Vascular: No hyperdense vessel or unexpected calcification. Skull: Normal. Negative for fracture or focal lesion. Sinuses/Orbits: No acute finding. Other: None IMPRESSION: Negative non contrasted CT appearance of the brain Electronically Signed   By: Jasmine Pang M.D.   On: 01/04/2021 19:17    Procedures Procedures   Medications Ordered in ED Medications - No data to display  ED Course  I have reviewed the triage vital signs and the nursing notes.  Pertinent labs & imaging results that were available during my care of the patient were reviewed by me and considered in my medical decision making (see chart for details).    MDM Rules/Calculators/A&P                           Patient with right upper extremity weakness.  Particular to external rotation of the arm.  No neck pain.  Also some tingling in the right leg.  Head CT reassuring but definitely has a deficit.  No injury.  I feels the patient needs further imaging with MRI to evaluate for other abnormality such as MS or spinal disease.  Ordered MRI with and without contrast.  Will transfer to [facility] for imaging.  Discussed with Dr. Criss Alvine who accepted.  Would likely need neurologic follow-up if no other abnormality found.  Potentially could get neuro consult in the emergency  room if needed. Patient and husband have elected to go by private vehicle Final Clinical Impression(s) / ED Diagnoses Final diagnoses:  None    Rx / DC Orders ED Discharge Orders     None        Benjiman Core, MD 01/04/21 2112

## 2021-01-04 NOTE — ED Triage Notes (Signed)
Patient reports right sided arm and leg weakness since 0400 this morning. Patient reports it started in her right arm and has progressed to the right leg throughout the day. Patient denies N/V/D, denies chest pain or ShoB or any other neuro symptoms.

## 2021-01-04 NOTE — Discharge Instructions (Addendum)
You were seen today for arm weakness.  Your MRI of head and C-spine is negative.  No evidence of stroke or MS.  Sometimes you can have impingements of the nerve or vascular supply to the arm causing a palsy.  This can result weakness or numbness feeling.  Start ibuprofen 600 mg every 6 hours.  Avoid heavy lifting or weightlifting, exertional workout for the next 2 to 3 days.  Avoid awkward sleeping positions.  Follow-up with sports medicine not improving.  If you have any new or worsening symptoms, you should be reevaluated.

## 2021-01-05 ENCOUNTER — Emergency Department (HOSPITAL_COMMUNITY): Payer: BC Managed Care – PPO

## 2021-01-05 IMAGING — MR MR CERVICAL SPINE WO/W CM
6 series · 31 of 48 positions shown · IV contrast (gadavist)
Comparison: None.

CLINICAL DATA: Right arm and leg weakness

EXAM:
MRI HEAD WITHOUT AND WITH CONTRAST
MRI CERVICAL SPINE WITHOUT AND WITH CONTRAST
TECHNIQUE: Multiplanar, multiecho pulse sequences of the brain and surrounding
structures, and cervical spine, to include the craniocervical
junction and cervicothoracic junction, were obtained without and
with intravenous contrast.
CONTRAST:  7mL GADAVIST GADOBUTROL 1 MMOL/ML IV SOLN

[Series 9: T2 · sagittal · 3.0mm · 0.69mm/px · 4 of 15 slices shown (1 of 2)]
[im 1/15]
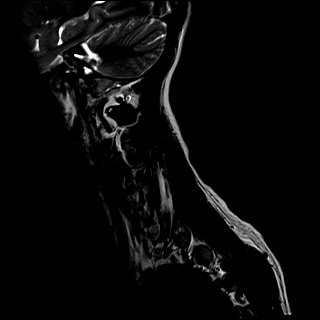
[im 5/15]
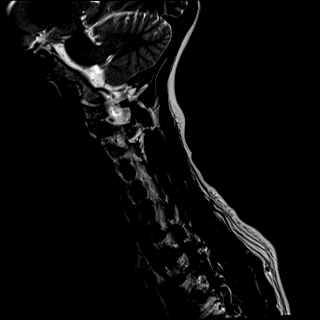
[im 10/15]
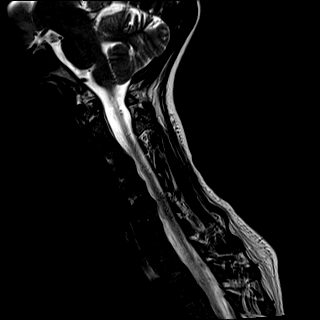
[im 15/15]
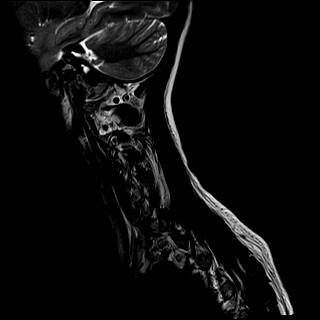

[Series 10: T1 · sagittal · 3.0mm · 0.69mm/px · 4 of 15 slices shown (1 of 2)]
[im 1/15]
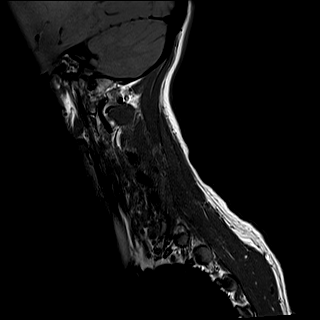
[im 5/15]
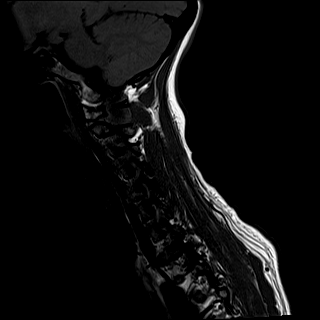
[im 10/15]
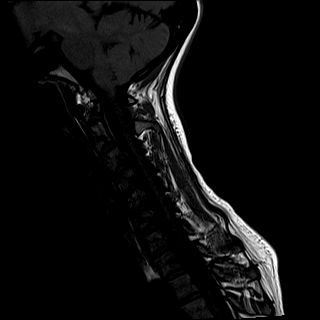
[im 15/15]
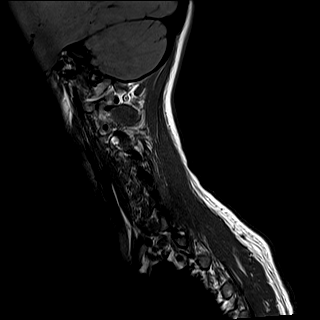

[Series 11: STIR · sagittal · 3.0mm · 0.86mm/px · 4 of 15 slices shown]
[im 1/15]
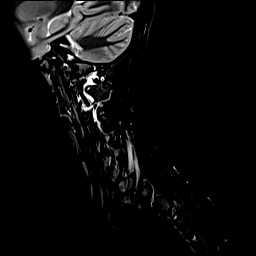
[im 5/15]
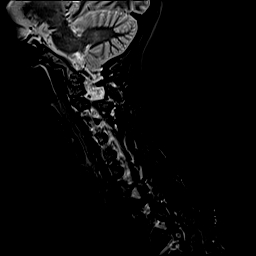
[im 10/15]
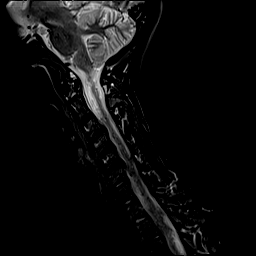
[im 15/15]
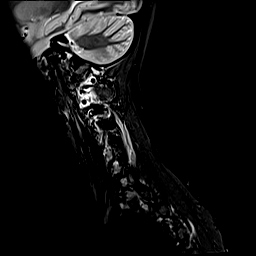

[Series 12: T2 · axial · 3.0mm · 0.66mm/px · z∈[-247,-129]mm · 9 of 40 slices shown (2 of 2)]
[im 1/40]
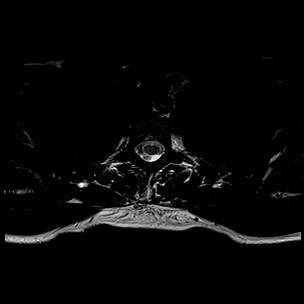
[im 8/40]
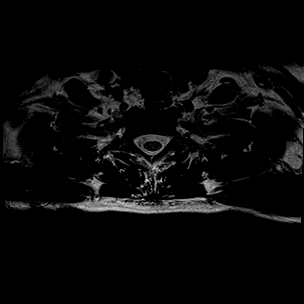
[im 11/40]
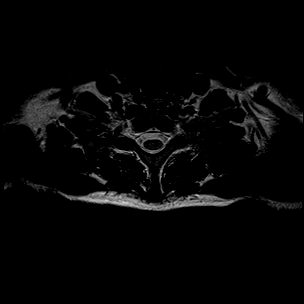
[im 18/40]
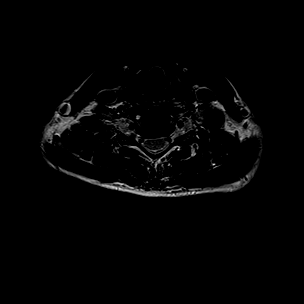
[im 22/40]
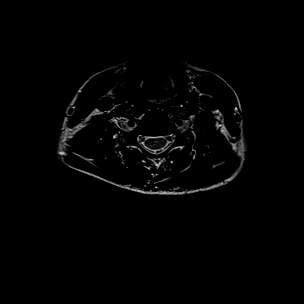
[im 29/40]
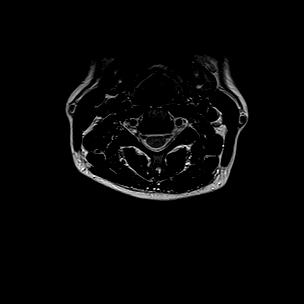
[im 32/40]
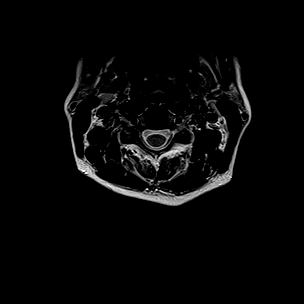
[im 36/40]
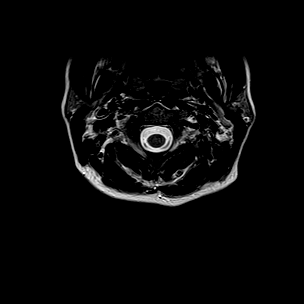
[im 40/40]
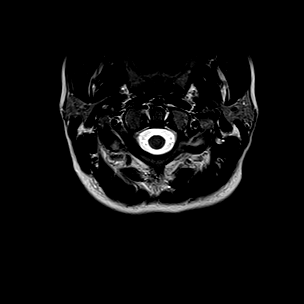

[Series 13: GRE · axial · 3.0mm · 0.39mm/px · 1 of 40 slices shown]
[im 1/40]
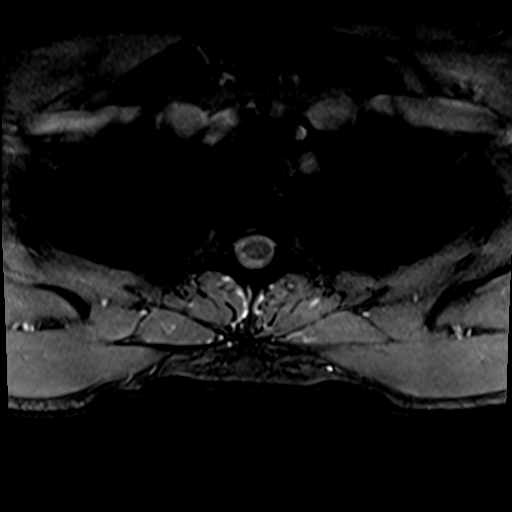

[Series 14: T1 · axial · 3.0mm · 0.39mm/px · z∈[-247,-129]mm · 9 of 40 slices shown (2 of 2)]
[im 1/40]
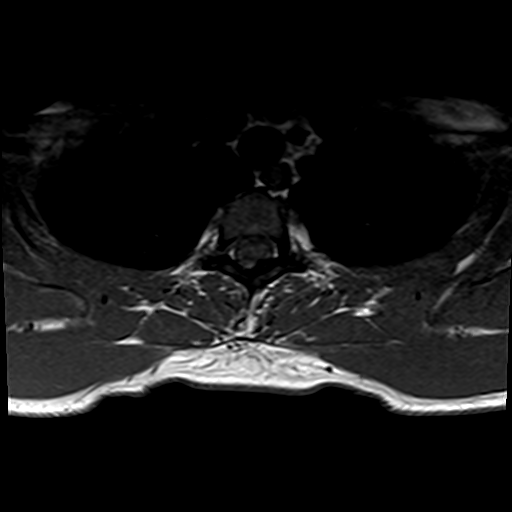
[im 8/40]
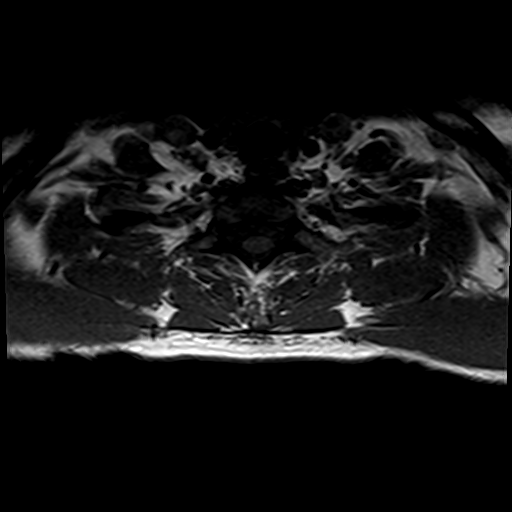
[im 11/40]
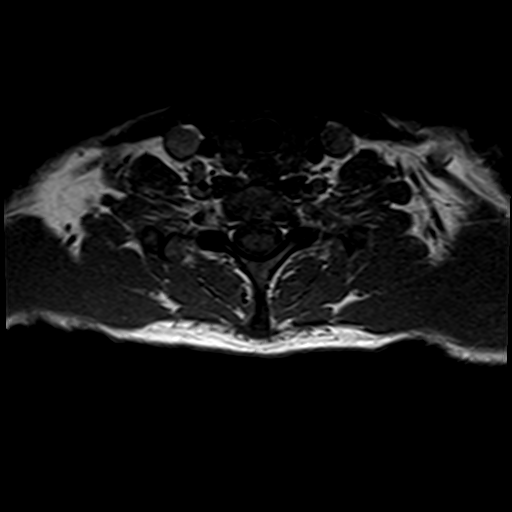
[im 18/40]
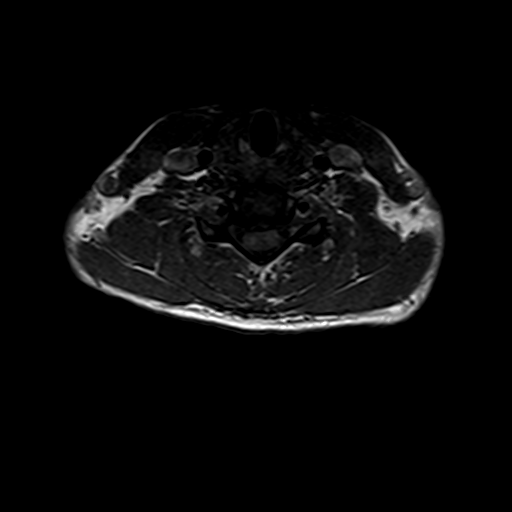
[im 22/40]
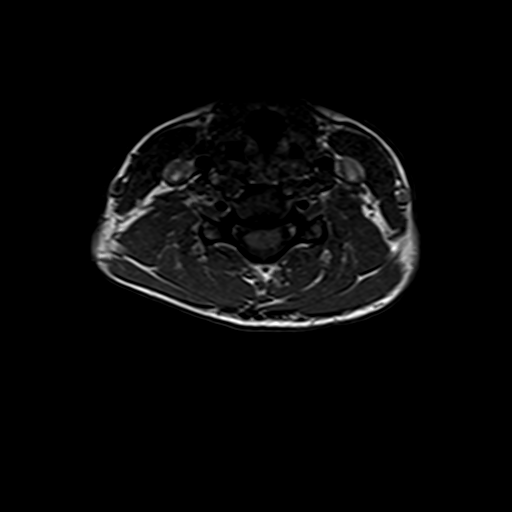
[im 29/40]
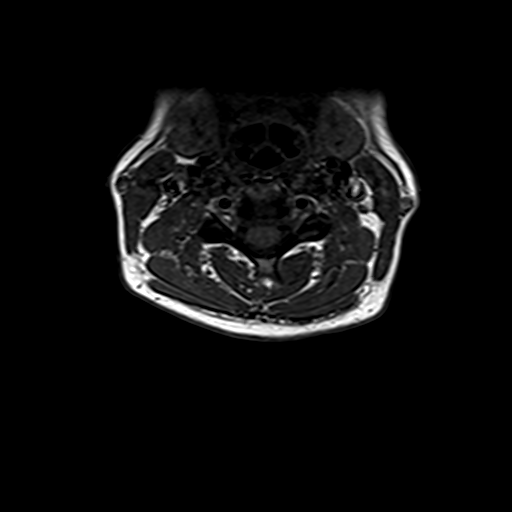
[im 32/40]
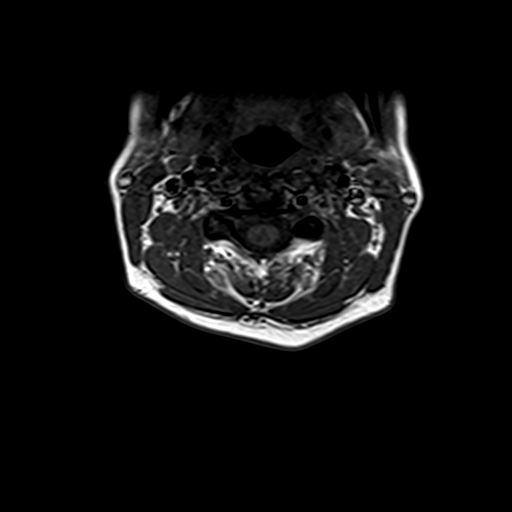
[im 36/40]
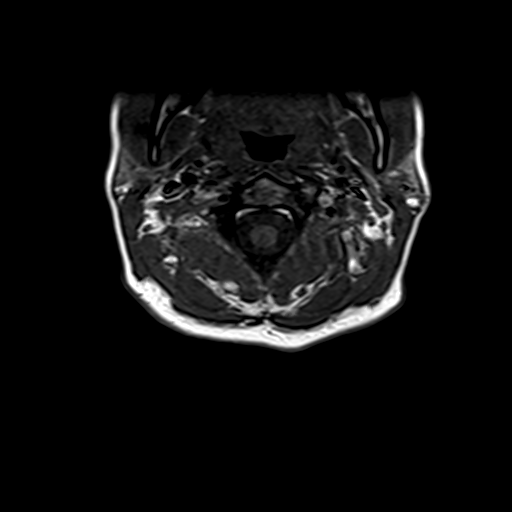
[im 40/40]
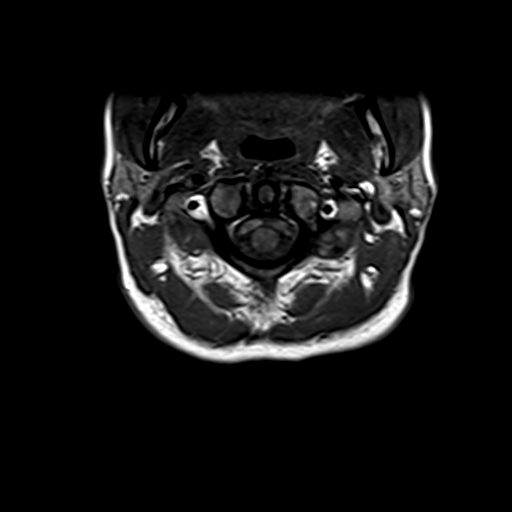

[31 of 48 positions shown; findings below may reference images not displayed]

FINDINGS: MRI HEAD FINDINGS

Brain: No acute infarct, mass effect or extra-axial collection. No
acute or chronic hemorrhage. Normal white matter signal, parenchymal
volume and CSF spaces. The midline structures are normal. There is
no abnormal contrast enhancement.

Vascular: Major flow voids are preserved.

Skull and upper cervical spine: Normal calvarium and skull base.
Visualized upper cervical spine and soft tissues are normal.

Sinuses/Orbits:No paranasal sinus fluid levels or advanced mucosal
thickening. No mastoid or middle ear effusion. Normal orbits.

MRI CERVICAL SPINE FINDINGS

Alignment: Reversal of normal cervical lordosis may be positional or
due to muscle spasm.

Vertebrae: No fracture, evidence of discitis, or bone lesion.

Cord: Normal signal and morphology.

Posterior Fossa, vertebral arteries, paraspinal tissues: Negative.

Disc levels:

C1-2: Unremarkable.

C2-3: Normal disc space and facet joints. There is no spinal canal
stenosis. No neural foraminal stenosis.

C3-4: Small disc bulge with uncovertebral spurring. There is no
spinal canal stenosis. No neural foraminal stenosis.

C4-5: Normal disc space and facet joints. There is no spinal canal
stenosis. No neural foraminal stenosis.

C5-6: Small disc bulge with right-greater-than-left uncovertebral
hypertrophy. There is no spinal canal stenosis. No neural foraminal
stenosis.

C6-7: Normal disc space and facet joints. There is no spinal canal
stenosis. No neural foraminal stenosis.

C7-T1: Normal disc space and facet joints. There is no spinal canal
stenosis. No neural foraminal stenosis.

No abnormal contrast enhancement.
IMPRESSION: 1. Normal MRI of the brain.
2. Mild cervical degenerative disc disease without spinal canal or
neural foraminal stenosis.

## 2021-01-05 IMAGING — MR MR CERVICAL SPINE WO/W CM
2 series · 18 of 48 positions shown · IV contrast (Gadavist)
Comparison: None.

CLINICAL DATA: Right arm and leg weakness

EXAM:
MRI HEAD WITHOUT AND WITH CONTRAST
MRI CERVICAL SPINE WITHOUT AND WITH CONTRAST
TECHNIQUE: Multiplanar, multiecho pulse sequences of the brain and surrounding
structures, and cervical spine, to include the craniocervical
junction and cervicothoracic junction, were obtained without and
with intravenous contrast.
CONTRAST:  7mL GADAVIST GADOBUTROL 1 MMOL/ML IV SOLN

[Series 21: T1 fat-sat post-contrast · sagittal · 3.0mm · 0.43mm/px · 9 of 15 slices shown]
[im 1/15]
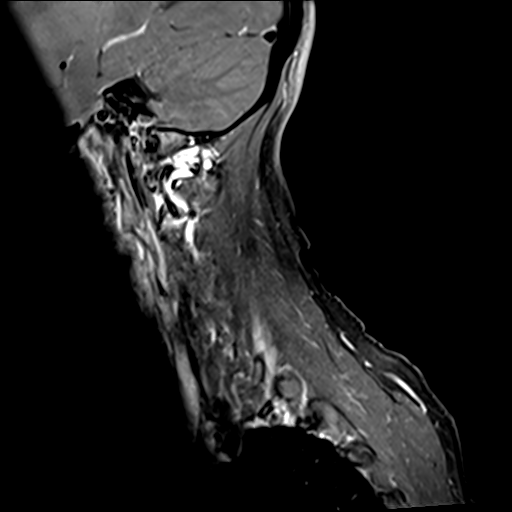
[im 3/15]
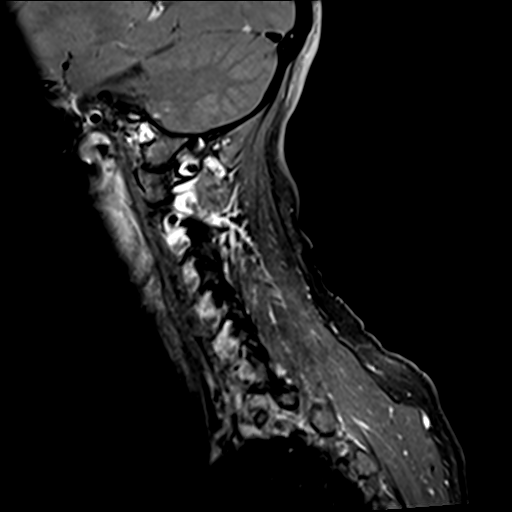
[im 5/15]
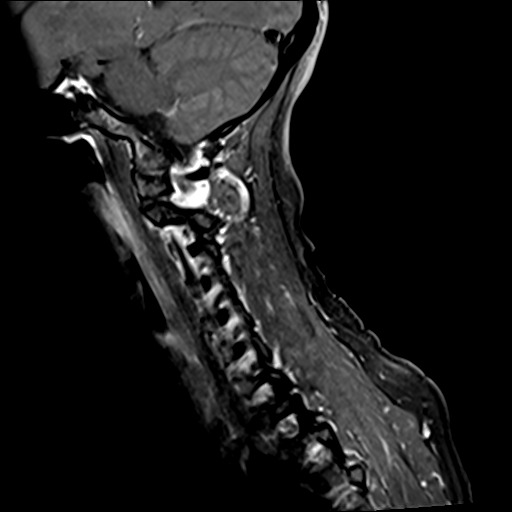
[im 6/15]
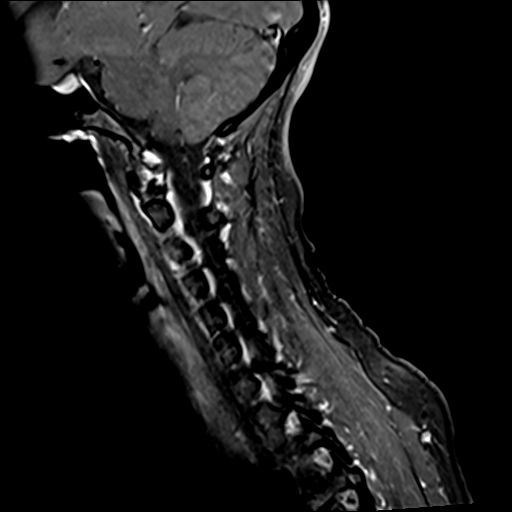
[im 8/15]
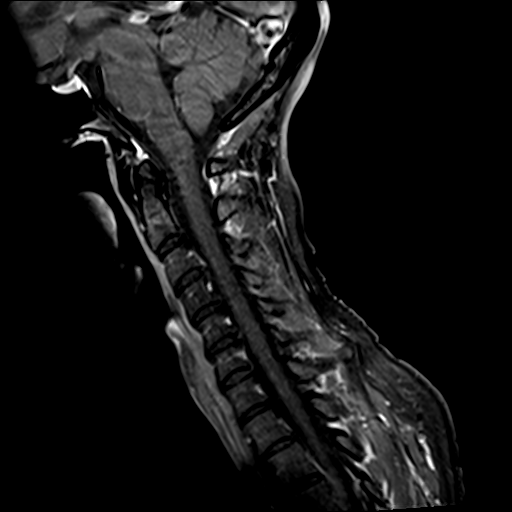
[im 9/15]
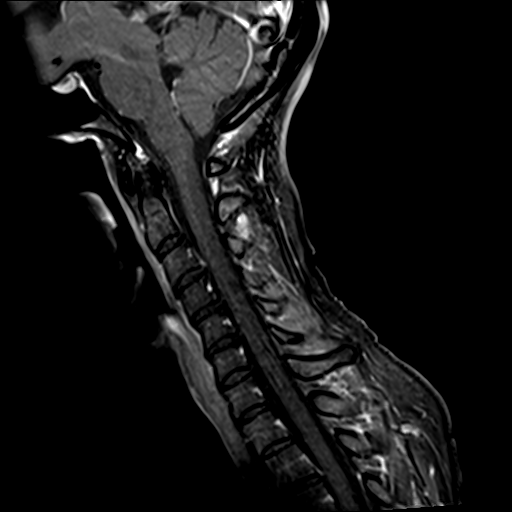
[im 10/15]
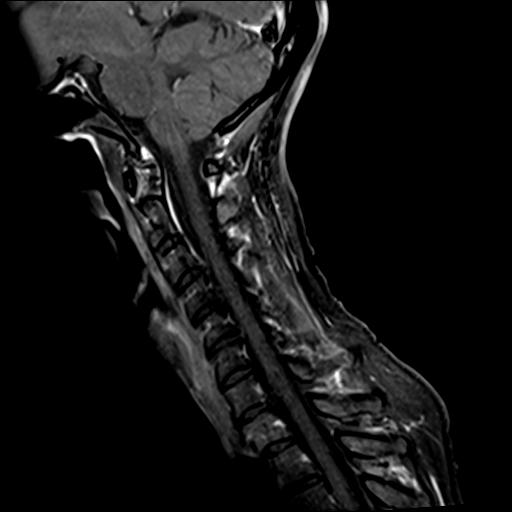
[im 12/15]
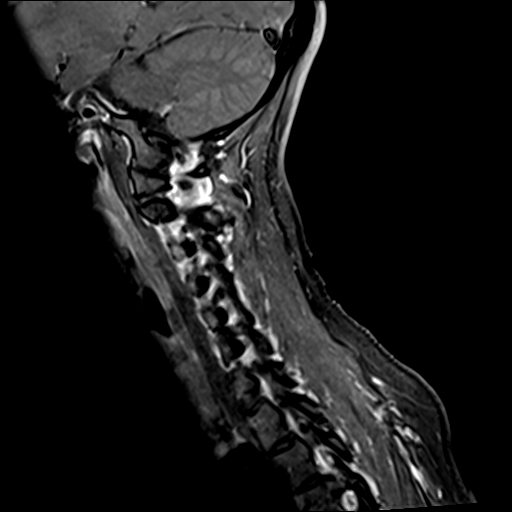
[im 15/15]
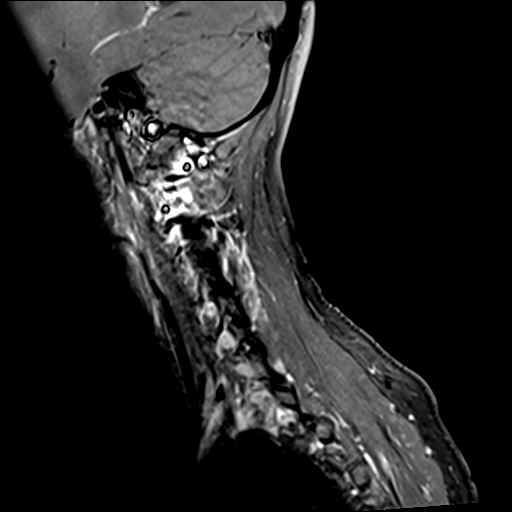

[Series 22: T1 post-contrast · axial · 3.0mm · 0.39mm/px · z∈[-241,-147]mm · 9 of 40 slices shown]
[im 3/40]
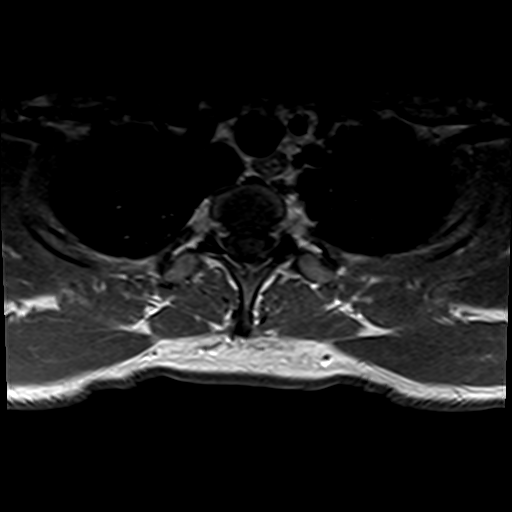
[im 6/40]
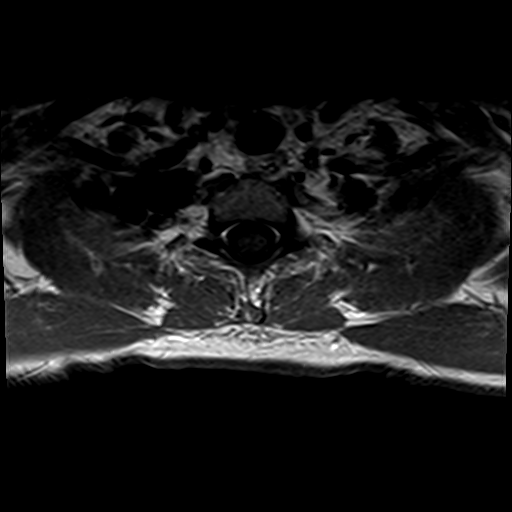
[im 7/40]
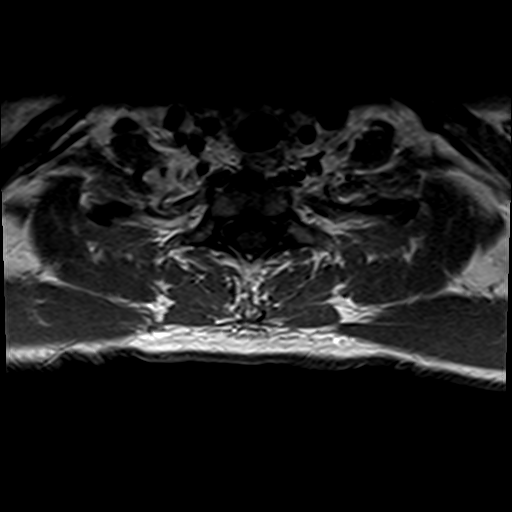
[im 12/40]
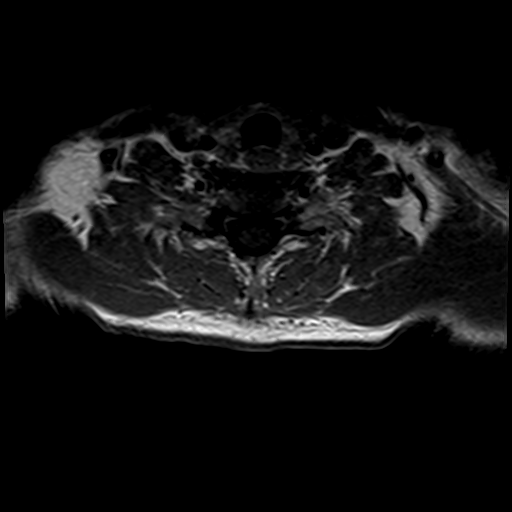
[im 18/40]
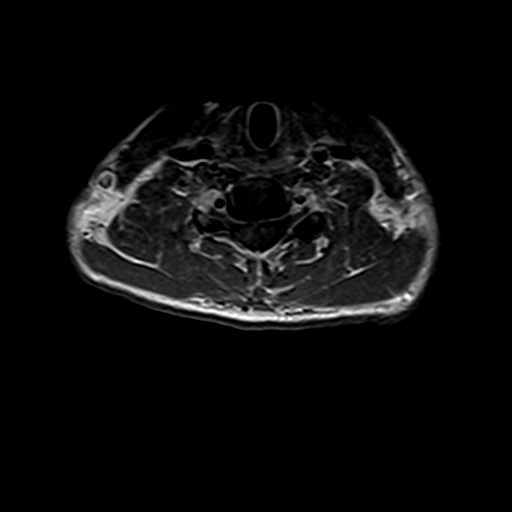
[im 20/40]
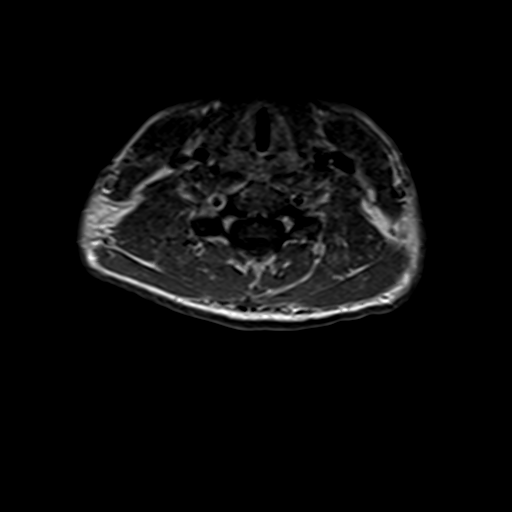
[im 22/40]
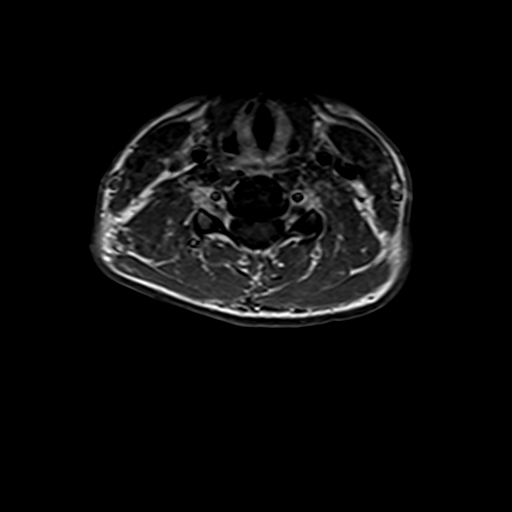
[im 28/40]
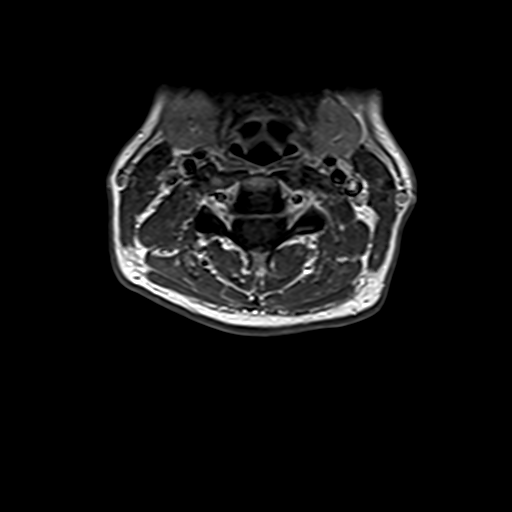
[im 34/40]
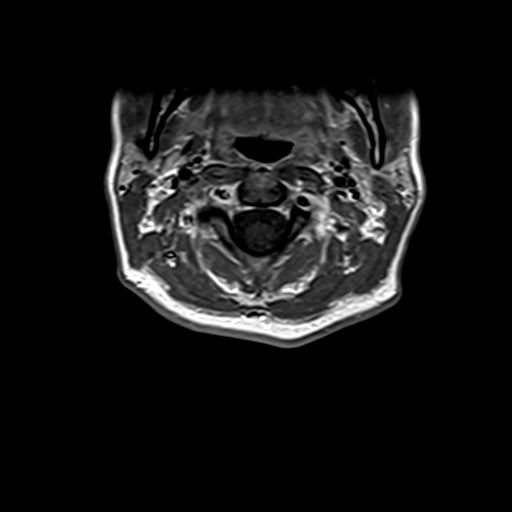

[18 of 48 positions shown; findings below may reference images not displayed]

FINDINGS: MRI HEAD FINDINGS

Brain: No acute infarct, mass effect or extra-axial collection. No
acute or chronic hemorrhage. Normal white matter signal, parenchymal
volume and CSF spaces. The midline structures are normal. There is
no abnormal contrast enhancement.

Vascular: Major flow voids are preserved.

Skull and upper cervical spine: Normal calvarium and skull base.
Visualized upper cervical spine and soft tissues are normal.

Sinuses/Orbits:No paranasal sinus fluid levels or advanced mucosal
thickening. No mastoid or middle ear effusion. Normal orbits.

MRI CERVICAL SPINE FINDINGS

Alignment: Reversal of normal cervical lordosis may be positional or
due to muscle spasm.

Vertebrae: No fracture, evidence of discitis, or bone lesion.

Cord: Normal signal and morphology.

Posterior Fossa, vertebral arteries, paraspinal tissues: Negative.

Disc levels:

C1-2: Unremarkable.

C2-3: Normal disc space and facet joints. There is no spinal canal
stenosis. No neural foraminal stenosis.

C3-4: Small disc bulge with uncovertebral spurring. There is no
spinal canal stenosis. No neural foraminal stenosis.

C4-5: Normal disc space and facet joints. There is no spinal canal
stenosis. No neural foraminal stenosis.

C5-6: Small disc bulge with right-greater-than-left uncovertebral
hypertrophy. There is no spinal canal stenosis. No neural foraminal
stenosis.

C6-7: Normal disc space and facet joints. There is no spinal canal
stenosis. No neural foraminal stenosis.

C7-T1: Normal disc space and facet joints. There is no spinal canal
stenosis. No neural foraminal stenosis.

No abnormal contrast enhancement.
IMPRESSION: 1. Normal MRI of the brain.
2. Mild cervical degenerative disc disease without spinal canal or
neural foraminal stenosis.

## 2021-01-05 IMAGING — MR MR HEAD WO/W CM
15 of 17 series · 42 of 48 positions shown · IV contrast (gadavist)
Comparison: None.

CLINICAL DATA: Right arm and leg weakness

EXAM:
MRI HEAD WITHOUT AND WITH CONTRAST
MRI CERVICAL SPINE WITHOUT AND WITH CONTRAST
TECHNIQUE: Multiplanar, multiecho pulse sequences of the brain and surrounding
structures, and cervical spine, to include the craniocervical
junction and cervicothoracic junction, were obtained without and
with intravenous contrast.
CONTRAST:  7mL GADAVIST GADOBUTROL 1 MMOL/ML IV SOLN

[Series 5: DWI · axial · 3.0mm · 0.88mm/px · z∈[-137,+9]mm · 7 of 104 slices shown (1 of 4)]
[im 1/104]
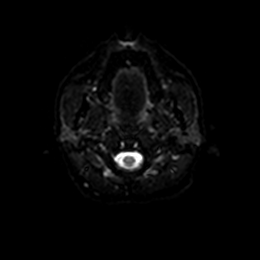
[im 18/104]
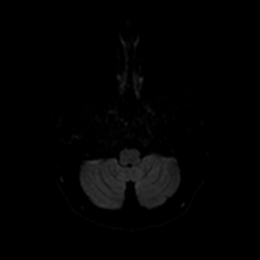
[im 35/104]
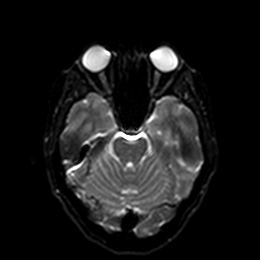
[im 52/104]
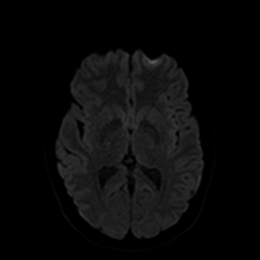
[im 69/104]
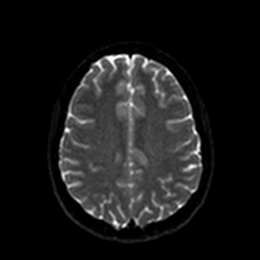
[im 86/104]
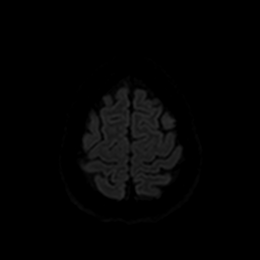
[im 104/104]
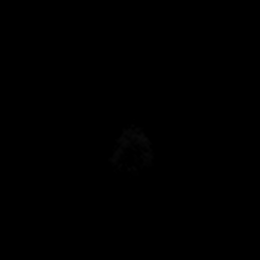

[Series 6: DWI · axial · 3.0mm · 0.88mm/px · z∈[-137,+9]mm · 4 of 52 slices shown (2 of 4)]
[im 1/52]
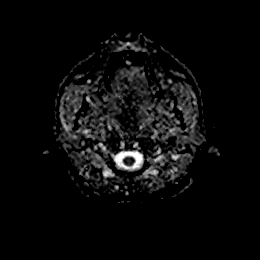
[im 18/52]
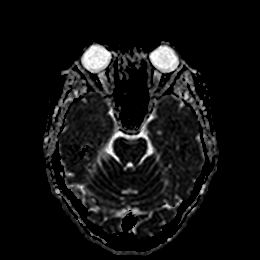
[im 35/52]
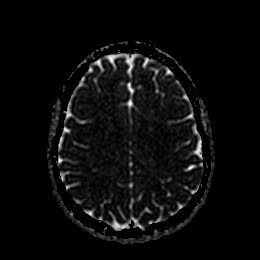
[im 52/52]
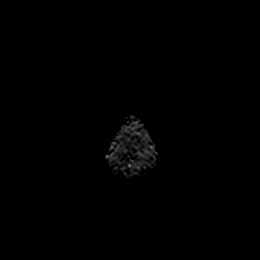

[Series 7: DWI · coronal · 4.0mm · 0.88mm/px · 5 of 72 slices shown (3 of 4)]
[im 1/72]
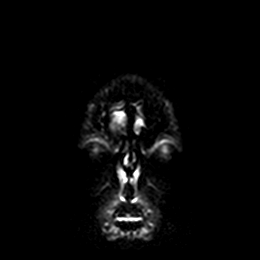
[im 18/72]
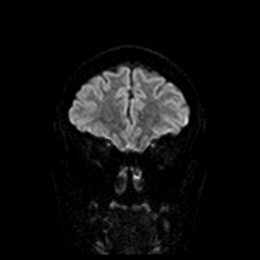
[im 36/72]
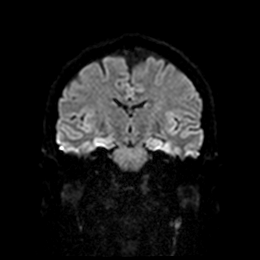
[im 54/72]
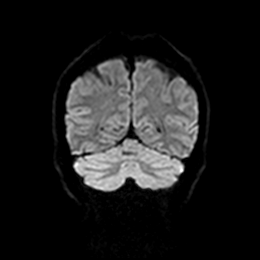
[im 72/72]
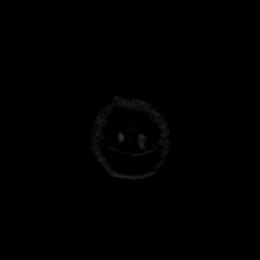

[Series 8: DWI · coronal · 4.0mm · 0.88mm/px · 3 of 36 slices shown (4 of 4)]
[im 1/36]
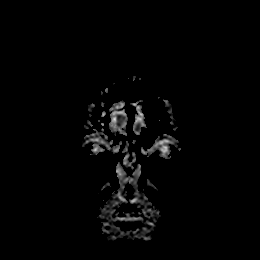
[im 18/36]
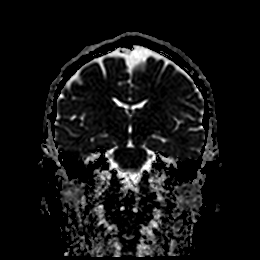
[im 36/36]
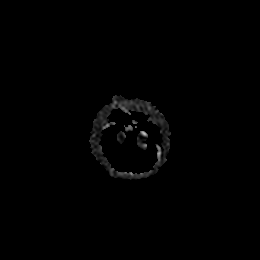

[Series 9: T1 · sagittal · 5.0mm · 0.75mm/px · 1 of 24 slices shown]
[im 1/24]
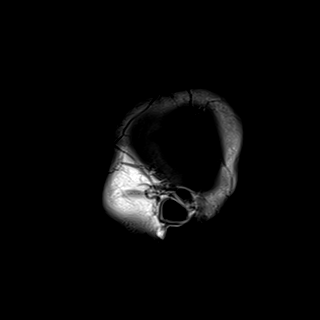

[Series 10: T2 · axial · 5.0mm · 0.72mm/px · z∈[-138,+11]mm · 2 of 27 slices shown]
[im 1/27]
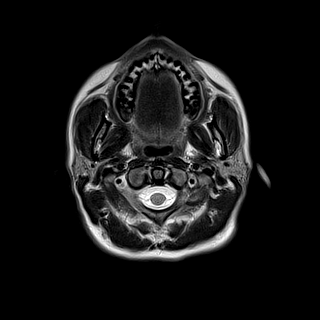
[im 27/27]
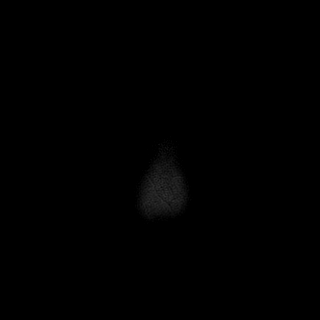

[Series 11: FLAIR · axial · 5.0mm · 0.45mm/px · z∈[-137,+11]mm · 2 of 27 slices shown]
[im 1/27]
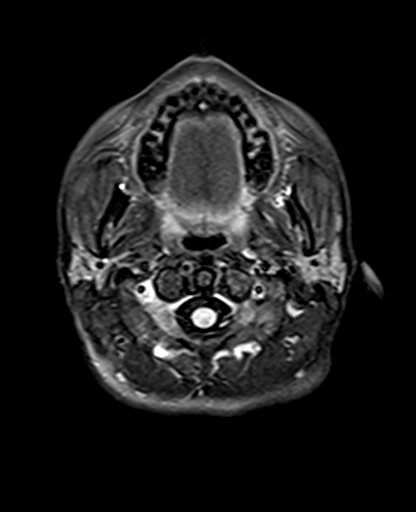
[im 27/27]
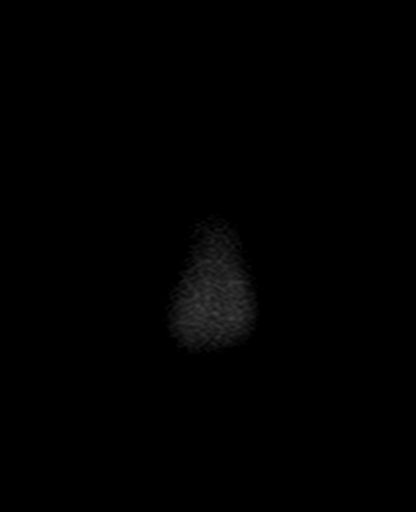

[Series 12: mag_images · axial · 3.0mm · 0.90mm/px · z∈[-142,+16]mm · 3 of 56 slices shown]
[im 1/56]
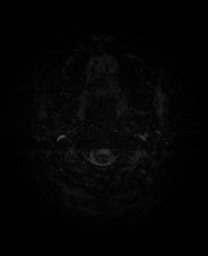
[im 28/56]
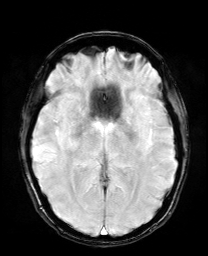
[im 56/56]
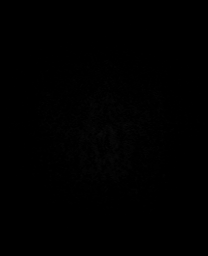

[Series 13: pha_images · axial · 3.0mm · 0.90mm/px · z∈[-142,+10]mm · 3 of 54 slices shown]
[im 1/54]
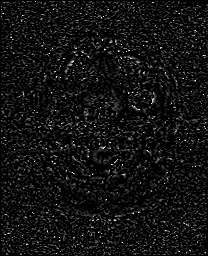
[im 27/54]
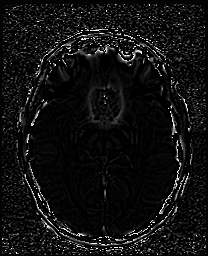
[im 54/54]
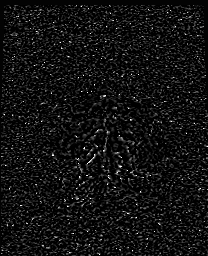

[Series 14: swi_images · axial · 3.0mm · 0.90mm/px · z∈[-142,+16]mm · 3 of 56 slices shown]
[im 1/56]
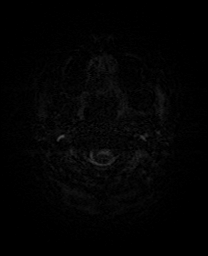
[im 28/56]
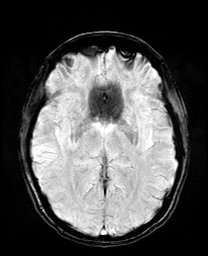
[im 56/56]
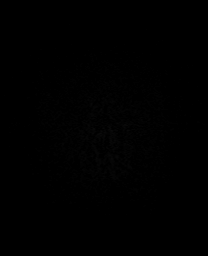

[Series 15: mip_images(sw) · axial · 24.0mm · 0.90mm/px · z∈[-132,+6]mm · 3 of 49 slices shown]
[im 1/49]
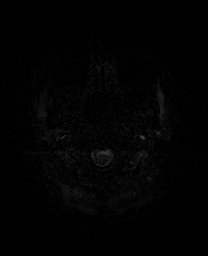
[im 25/49]
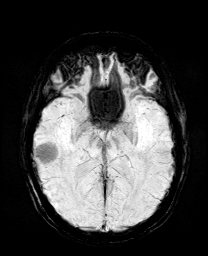
[im 49/49]
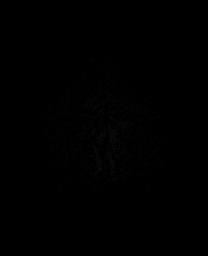

[Series 17: T2 post-contrast · coronal · 5.0mm · 0.72mm/px · 2 of 31 slices shown]
[im 1/31]
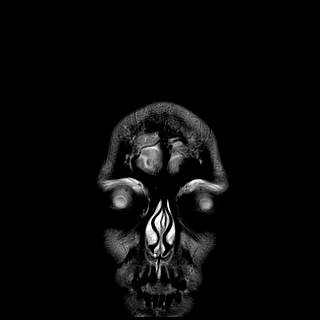
[im 31/31]
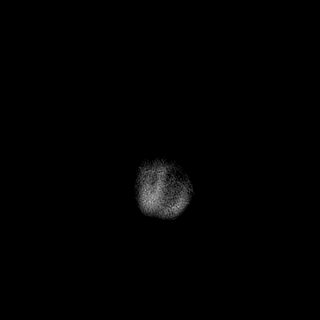

[Series 19: T1 post-contrast · coronal · 5.0mm · 0.34mm/px · 2 of 31 slices shown (1 of 2)]
[im 1/31]
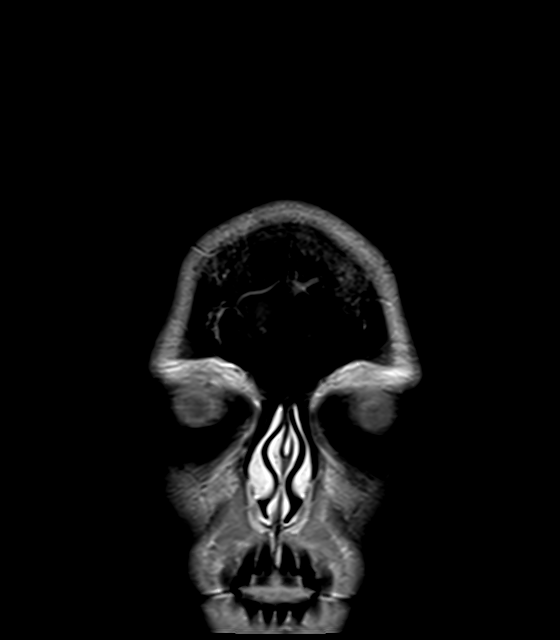
[im 31/31]
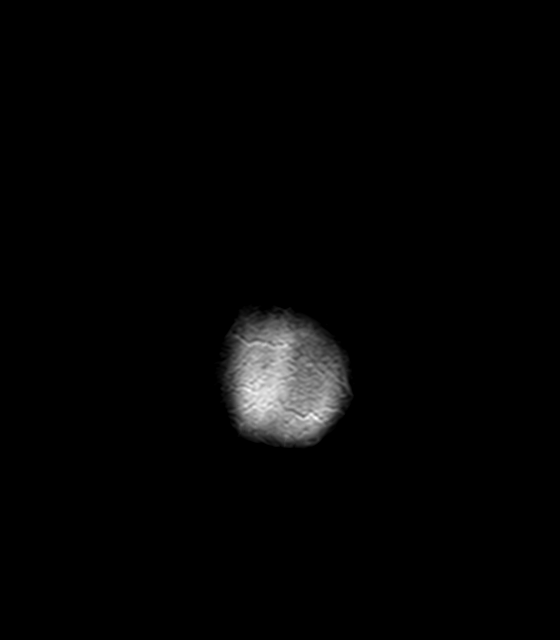

[Series 20: T1 post-contrast · sagittal · 5.0mm · 0.72mm/px · 1 of 24 slices shown (2 of 2)]
[im 1/24]
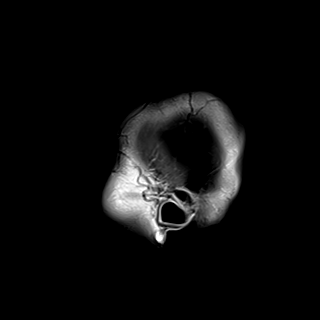

[Series 21: T1 fat-sat post-contrast · sagittal · 3.0mm · 0.43mm/px · 1 of 15 slices shown]
[im 1/15]
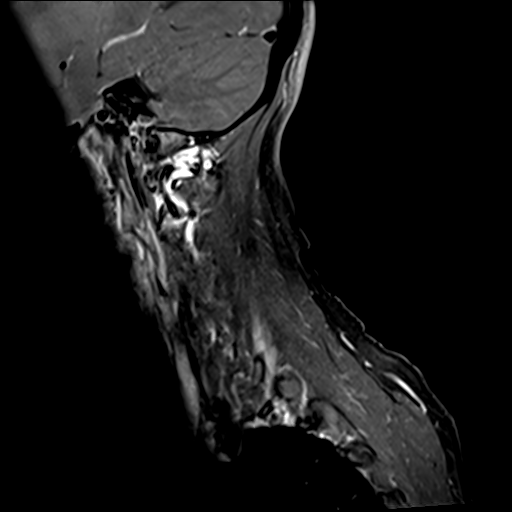

[42 of 48 positions shown; findings below may reference images not displayed]

FINDINGS: MRI HEAD FINDINGS

Brain: No acute infarct, mass effect or extra-axial collection. No
acute or chronic hemorrhage. Normal white matter signal, parenchymal
volume and CSF spaces. The midline structures are normal. There is
no abnormal contrast enhancement.

Vascular: Major flow voids are preserved.

Skull and upper cervical spine: Normal calvarium and skull base.
Visualized upper cervical spine and soft tissues are normal.

Sinuses/Orbits:No paranasal sinus fluid levels or advanced mucosal
thickening. No mastoid or middle ear effusion. Normal orbits.

MRI CERVICAL SPINE FINDINGS

Alignment: Reversal of normal cervical lordosis may be positional or
due to muscle spasm.

Vertebrae: No fracture, evidence of discitis, or bone lesion.

Cord: Normal signal and morphology.

Posterior Fossa, vertebral arteries, paraspinal tissues: Negative.

Disc levels:

C1-2: Unremarkable.

C2-3: Normal disc space and facet joints. There is no spinal canal
stenosis. No neural foraminal stenosis.

C3-4: Small disc bulge with uncovertebral spurring. There is no
spinal canal stenosis. No neural foraminal stenosis.

C4-5: Normal disc space and facet joints. There is no spinal canal
stenosis. No neural foraminal stenosis.

C5-6: Small disc bulge with right-greater-than-left uncovertebral
hypertrophy. There is no spinal canal stenosis. No neural foraminal
stenosis.

C6-7: Normal disc space and facet joints. There is no spinal canal
stenosis. No neural foraminal stenosis.

C7-T1: Normal disc space and facet joints. There is no spinal canal
stenosis. No neural foraminal stenosis.

No abnormal contrast enhancement.
IMPRESSION: 1. Normal MRI of the brain.
2. Mild cervical degenerative disc disease without spinal canal or
neural foraminal stenosis.

## 2021-01-05 MED ORDER — GADOBUTROL 1 MMOL/ML IV SOLN
7.0000 mL | Freq: Once | INTRAVENOUS | Status: AC | PRN
  Administered 2021-01-05: 7 mL via INTRAVENOUS

## 2021-01-05 NOTE — ED Provider Notes (Signed)
Patient transferred from [facility] for MRIs of the head and C-spine to rule out MS or other occupying lesion.  In brief woke up yesterday morning with numbness and weakness of the right arm.  Describes difficulty and heaviness in that right arm.  Also states that she was unable to do some of her workout and externally rotate and abduct her arm.  MRIs here negative for acute stroke or signs of lesions concerning for MS.  Patient denies any injury to the arm, awkward sleeping position.  She does do Nurse, mental health.  Question whether she may have developed a palsy from positioning or muscle hypertrophy.  We discussed conservative treatment with anti-inflammatory medications and discontinuing workouts for the next 2 to 3 days.  Follow-up with sports medicine recommended.  Physical Exam  BP 120/88   Pulse 86   Temp 98.3 F (36.8 C) (Oral)   Resp 17   Ht 1.626 m (5\' 4" )   Wt 66.2 kg   LMP 01/01/2021 (Exact Date)   SpO2 98%   BMI 25.06 kg/m   Physical Exam  ED Course/Procedures     Procedures  MDM   Problem List Items Addressed This Visit   None Visit Diagnoses     Weakness    -  Primary   Palsy (HCC)                01/03/2021 Wilkie Aye, MD 01/05/21 [phone]

## 2021-02-04 ENCOUNTER — Ambulatory Visit: Admitting: Obstetrics and Gynecology
